# Patient Record
Sex: Female | Born: 1990 | Race: Asian | Hispanic: No | Marital: Married | Smoking: Never smoker
Health system: Southern US, Community
[De-identification: ages and names within clinical notes are randomized; demographics above are authoritative.]

## PROBLEM LIST (undated history)

## (undated) ENCOUNTER — Inpatient Hospital Stay (HOSPITAL_COMMUNITY): Payer: Self-pay

## (undated) DIAGNOSIS — B1910 Unspecified viral hepatitis B without hepatic coma: Secondary | ICD-10-CM

## (undated) HISTORY — DX: Unspecified viral hepatitis B without hepatic coma: B19.10

## (undated) HISTORY — PX: NO PAST SURGERIES: SHX2092

---

## 2016-10-18 ENCOUNTER — Other Ambulatory Visit: Payer: Self-pay | Admitting: Family Medicine

## 2016-10-18 ENCOUNTER — Ambulatory Visit (INDEPENDENT_AMBULATORY_CARE_PROVIDER_SITE_OTHER): Payer: No Typology Code available for payment source | Admitting: Family Medicine

## 2016-10-18 ENCOUNTER — Ambulatory Visit (HOSPITAL_COMMUNITY)
Admission: RE | Admit: 2016-10-18 | Discharge: 2016-10-18 | Disposition: A | Discharge status: Home / Self Care | Payer: PRIVATE HEALTH INSURANCE | Source: Ambulatory Visit | Attending: Family Medicine | Admitting: Family Medicine

## 2016-10-18 VITALS — BP 100/62 | HR 72 | Temp 98.2°F | Resp 16 | Ht 63.0 in | Wt 127.0 lb

## 2016-10-18 DIAGNOSIS — Z3A01 Less than 8 weeks gestation of pregnancy: Secondary | ICD-10-CM

## 2016-10-18 DIAGNOSIS — N912 Amenorrhea, unspecified: Secondary | ICD-10-CM

## 2016-10-18 DIAGNOSIS — Z3689 Encounter for other specified antenatal screening: Secondary | ICD-10-CM | POA: Diagnosis not present

## 2016-10-18 LAB — POCT URINE PREGNANCY: Preg Test, Ur: POSITIVE — AB

## 2016-10-18 LAB — POCT URINALYSIS DIP (MANUAL ENTRY)
BILIRUBIN UA: NEGATIVE
BILIRUBIN UA: NEGATIVE
GLUCOSE UA: NEGATIVE
Leukocytes, UA: NEGATIVE
Nitrite, UA: NEGATIVE
PH UA: 7
Protein Ur, POC: NEGATIVE
RBC UA: NEGATIVE
Spec Grav, UA: 1.025
Urobilinogen, UA: 0.2

## 2016-10-18 NOTE — Progress Notes (Signed)
Subjective:    Patient ID: Stephanie [name]  Nedra Hai(JULIE) Aldrete, female    DOB: 03-Aug-1991, 25 y.o.   MRN: [id number]  10/18/2016  Headache; Nausea; and Other (recently found out that she was pregnant)   HPI This 25 y.o. female presents for evaluation of recent discovery of pregnancy.  Having nausea and vomiting and headache.  LMP 09-11-2016.   [facility] [facility] [place]; tested and was positive.   Not sure if planning to keep the baby.  Very regular menses; bleeding every five days. Has an appointment on 10-29-2016.  Did not like this office; thus, coming here for guidance.  Vomiting 1-2 times every day; after each meal, vomits.  Just started this week.  Will be 10/25/16-10/31/16.  Review of Systems  Constitutional: Positive for appetite change. Negative for chills, diaphoresis, fatigue and fever.  Gastrointestinal: Positive for nausea and vomiting.    No past medical history on file. No past surgical history on file. No Known Allergies No current outpatient prescriptions on file.   No current facility-administered medications for this visit.    Social History   Social History  . Marital status: Single    Spouse name: N/A  . Number of children: N/A  . Years of education: N/A   Occupational History  . Not on file.   Social History Main Topics  . Smoking status: Never Smoker  . Smokeless tobacco: Never Used  . Alcohol use Not on file  . Drug use: Unknown  . Sexual activity: Not on file   Other Topics Concern  . Not on file   Social History Narrative   From [place]; in [place] for 3 months.    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     Objective:    BP 100/62 (BP Location: Right Arm, Patient Position: Sitting, Cuff Size: Normal)   Pulse 72   Temp 98.2 F (36.8 C)   Resp 16   Ht 5\' 3"  (1.6 m)   Wt 127 lb (57.6 kg)   LMP 09/11/2016  (Approximate)   SpO2 100%   BMI 22.50 kg/m  Physical Exam  Constitutional: She is oriented to person, place, and time. She appears well-developed and well-nourished. No distress.  HENT:  Head: Normocephalic and atraumatic.  Right Ear: External ear normal.  Left Ear: External ear normal.  Nose: Nose normal.  Mouth/Throat: Oropharynx is clear and moist.  Eyes: Conjunctivae and EOM are normal. Pupils are equal, round, and reactive to light.  Neck: Normal range of motion. Neck supple. Carotid bruit is not present. No thyromegaly present.  Cardiovascular: Normal rate, regular rhythm, normal heart sounds and intact distal pulses.  Exam reveals no gallop and no friction rub.   No murmur heard. Pulmonary/Chest: Effort normal and breath sounds normal. She has no wheezes. She has no rales.  Abdominal: Soft. Bowel sounds are normal. She exhibits no distension and no mass. There is no tenderness. There is no rebound and no guarding.  Lymphadenopathy:    She has no cervical adenopathy.  Neurological: She is alert and oriented to person, place, and time. No cranial nerve deficit.  Skin: Skin is warm and dry. No rash noted. She is not diaphoretic. No erythema. No pallor.  Psychiatric: She has a normal mood and affect. Her behavior is normal.   Results for orders  placed or performed in visit on 10/18/16  POCT urinalysis dipstick  Result Value Ref Range   Color, UA yellow yellow   Clarity, UA clear clear   Glucose, UA negative negative   Bilirubin, UA negative negative   Ketones, POC UA negative negative   Spec Grav, UA 1.025    Blood, UA negative negative   pH, UA 7.0    Protein Ur, POC negative negative   Urobilinogen, UA 0.2    Nitrite, UA Negative Negative   Leukocytes, UA Negative Negative  POCT urine pregnancy  Result Value Ref Range   Preg Test, Ur Positive (A) Negative        Assessment & Plan:   1. Amenorrhea   2. Less than [redacted] weeks gestation of pregnancy    -New; obtain OB  ultrasound for confirmation of dates so that patient can proceed with decision regarding pregnancy. -provided patient with a list of local clinics who offer abortion therapies.  Orders Placed This Encounter  Procedures  . US OB Transvaginal    Standing Status:   Future    Standing Expiration Date:   12/18/2017    Order Specific Question:   Reason for Exam (SYMPTOM  OR DIAGNOSIS REQUIRED)    Answer:   urine pregnancy +; confirmation of dates    Order Specific Question:   Preferred imaging location?    [facility]  . US OP OB Comp Less 14 Wks    Standing Status:   Future    Standing Expiration Date:   12/18/2017    Order Specific Question:   Reason for Exam (SYMPTOM  OR DIAGNOSIS REQUIRED)    Answer:   urine pregnancy +; confirmation of dates    Order Specific Question:   Preferred imaging location?    [facility]  . POCT urinalysis dipstick  . POCT urine pregnancy   No orders of the defined types were placed in this encounter.   No Follow-up on file.   Reyes Aldaco Paulita FujitaMartin Marky Buresh, M.D. [facility]  [place] [address] [place], [place]  [id number] [phone] phone [phone] fax

## 2016-10-18 NOTE — Patient Instructions (Addendum)
[facility] today at 2:45pm for an Ultrasound.  Go directly to admitting and they will escort you to Radiology for a 3:00 appointment.    Please call for an appointment: [facility] [address] [place], [place]  [id number] [phone]    IF you received an x-ray today, you will receive an invoice from [facility]. Please contact [facility] at [phone] with questions or concerns regarding your invoice.   IF you received labwork today, you will receive an invoice from [facility]. Please contact [facility] at [phone] with questions or concerns regarding your invoice.   Our billing staff will not be able to assist you with questions regarding bills from these companies.  You will be contacted with the lab results as soon as they are available. The fastest way to get your results is to activate your My Chart account. Instructions are located on the last page of this paperwork. If you have not heard from us regarding the results in 2 weeks, please contact this office.

## 2016-10-22 ENCOUNTER — Encounter: Payer: Self-pay | Admitting: Radiology

## 2016-10-22 ENCOUNTER — Telehealth: Payer: Self-pay

## 2016-10-22 NOTE — Progress Notes (Signed)
Attempted to contact pt, spoke with BF and told him to have her give us a call back.

## 2016-10-22 NOTE — Telephone Encounter (Signed)
Patient's significant other called.  He states that Dr. Katrinka BlazingSmith was supposed to read the ultrasound results from [facility].  I told him that the physician over at the hospital should be the one who would give the results to them but he stated otherwise.  Please advise  [phone]

## 2016-10-23 NOTE — Telephone Encounter (Signed)
Patient contacted by Micah,rad tech 10/22/16 at 12:26pm.

## 2016-11-21 LAB — OB RESULTS CONSOLE ABO/RH: RH Type: POSITIVE

## 2016-11-21 LAB — OB RESULTS CONSOLE RPR: RPR: NONREACTIVE

## 2016-11-21 LAB — OB RESULTS CONSOLE GC/CHLAMYDIA: CHLAMYDIA, DNA PROBE: NEGATIVE

## 2016-11-21 LAB — OB RESULTS CONSOLE ANTIBODY SCREEN: ANTIBODY SCREEN: NEGATIVE

## 2016-11-21 LAB — OB RESULTS CONSOLE HIV ANTIBODY (ROUTINE TESTING): HIV: NONREACTIVE

## 2016-11-21 LAB — OB RESULTS CONSOLE RUBELLA ANTIBODY, IGM: RUBELLA: IMMUNE

## 2016-12-02 NOTE — L&D Delivery Note (Signed)
Indication for operative vaginal delivery:  Prolonged deceleration, minimal progress for stage 2 of labor with good maternal effort and maternal exhaustion.  Risks of vacuum assistance were discussed in detail, including but not limited to, bleeding, infection, damage to maternal tissues, fetal cephalohematoma, inability to effect vaginal delivery of the head or shoulder dystocia that cannot be resolved by established maneuvers and need for emergency cesarean section.  Patient gave verbal consent. Patient and husband were able to verbalize back risks/benefits without an interpretor present.   Patient was examined and found to be fully dilated with fetal station of +2. The soft Kiwi vacuum soft cup was positioned over the sagittal suture 3 cm anterior to posterior fontanelle.  Pressure was then increased to 500 mmHg, and the patient was instructed to push.  Pulling was administered along the pelvic curve.  7 pulls were administered during 7 pushes, 0 popoffs. A midline episiotomy was performed to deliver the head through the outlet. The infant was then delivered atraumatically in OA position, anterior shoulders delivered with ease, Baby was noted to be a viable female infant. 60 sec delayed cord clamping.  Cord clamped x2 and cut. There was spontaneous placental delivery, intact with three-vessel cord. Fundus firm on exam with massage and pitocin. Good hemostasis noted.  Anesthesia: Epidural Laceration: 2nd degree perineal with midline episiotomy Suture: 3-0 Vicryl Good hemostasis noted. EBL: 200cc  Mom and baby recovering in LDR.   Weight: Pending skin to skin  Sponge and instrument count were correct x2. Placenta sent to pathology.  Dr. Alysia PennaErvin was present for entire vacuum delivery.   Jen MowElizabeth Waldine Zenz, DO OB Fellow Center for [facility], [facility] 05/24/2017, 1:27 PM

## 2017-01-30 ENCOUNTER — Ambulatory Visit (INDEPENDENT_AMBULATORY_CARE_PROVIDER_SITE_OTHER): Payer: Self-pay | Admitting: Obstetrics and Gynecology

## 2017-01-30 ENCOUNTER — Encounter: Payer: Self-pay | Admitting: Obstetrics and Gynecology

## 2017-01-30 VITALS — BP 100/71 | HR 93 | Wt 145.7 lb

## 2017-01-30 DIAGNOSIS — Z3492 Encounter for supervision of normal pregnancy, unspecified, second trimester: Secondary | ICD-10-CM | POA: Insufficient documentation

## 2017-01-30 DIAGNOSIS — Z789 Other specified health status: Secondary | ICD-10-CM | POA: Insufficient documentation

## 2017-01-30 DIAGNOSIS — Z603 Acculturation difficulty: Secondary | ICD-10-CM | POA: Insufficient documentation

## 2017-01-30 MED ORDER — PRENATAL VITAMINS 0.8 MG PO TABS: 1.0000 | ORAL_TABLET | Freq: Every day | ORAL | 6 refills | Status: DC

## 2017-01-30 NOTE — Progress Notes (Signed)
   PRENATAL VISIT NOTE  Subjective:  Nickolas MadridZuli Julie Charley is a 26 y.o. G1P0 at 2112w1d being seen today for ongoing prenatal care.  She is currently monitored for the following issues for this low-risk pregnancy and has Supervision of low-risk pregnancy, second trimester on her problem list.  Patient reports no complaints.  Contractions: Not present. Vag. Bleeding: None.  Movement: Present. Denies leaking of fluid.   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 She had 3 prenatal care visits there. She has had some blood work done which was reviewed in addition to a PAP test. She has Water quality scientistpurchased insurance on the market place and it will be active April first. She asks that we wait to do any more testing until then.   The following portions of the patient's history were reviewed and updated as appropriate: allergies, current medications, past family history, past medical history, past social history, past surgical history and problem list. Problem list updated.  Objective:   Vitals:   01/30/17 1330  BP: 100/71  Pulse: 93  Weight: 145 lb 11.2 oz (66.1 kg)    Fetal Status: Fetal Heart Rate (bpm): 145   Movement: Present     General:  Alert, oriented and cooperative. Patient is in no acute distress.  Skin: Skin is warm and dry. No rash noted.   Cardiovascular: Normal heart rate noted  Respiratory: Normal respiratory effort, no problems with respiration noted  Abdomen: Soft, gravid, appropriate for gestational age. Pain/Pressure: Absent     Pelvic:  Cervical exam deferred        Extremities: Normal range of motion.  Edema: None  Mental Status: Normal mood and affect. Normal behavior. Normal judgment and thought content.   Assessment and Plan:  Pregnancy: G1P0 at 6712w1d  1. Supervision of low-risk pregnancy, second trimester - US MFM OB COMP + 14 WK; Future Please schedule after April 1st when insurance is active -patient will need GC/CT, Urine Culture,  CBC, HbSAg in April after insurance is active. -PAP done in [place] patient is working on getting request results.   Preterm labor symptoms and general obstetric precautions including but not limited to vaginal bleeding, contractions, leaking of fluid and fetal movement were reviewed in detail with the patient. Please refer to After Visit Summary for other counseling recommendations.  No Follow-up on file.   Lorne SkeensNicholas Michael Schenk, MD

## 2017-01-30 NOTE — Patient Instructions (Signed)
First Trimester of Pregnancy The first trimester of pregnancy is from week 1 until the end of week 13 (months 1 through 3). During this time, your baby will begin to develop inside you. At 6-8 weeks, the eyes and face are formed, and the heartbeat can be seen on ultrasound. At the end of 12 weeks, all the baby's organs are formed. Prenatal care is all the medical care you receive before the birth of your baby. Make sure you get good prenatal care and follow all of your doctor's instructions. Follow these instructions at home: Medicines  Take over-the-counter and prescription medicines only as told by your doctor. Some medicines are safe and some medicines are not safe during pregnancy.  Take a prenatal vitamin that contains at least 600 micrograms (mcg) of folic acid.  If you have trouble pooping (constipation), take medicine that will make your stool soft (stool softener) if your doctor approves. Eating and drinking  Eat regular, healthy meals.  Your doctor will tell you the amount of weight gain that is right for you.  Avoid raw meat and uncooked cheese.  If you feel sick to your stomach (nauseous) or throw up (vomit): ? Eat 4 or 5 small meals a day instead of 3 large meals. ? Try eating a few soda crackers. ? Drink liquids between meals instead of during meals.  To prevent constipation: ? Eat foods that are high in fiber, like fresh fruits and vegetables, whole grains, and beans. ? Drink enough fluids to keep your pee (urine) clear or pale yellow. Activity  Exercise only as told by your doctor. Stop exercising if you have cramps or pain in your lower belly (abdomen) or low back.  Do not exercise if it is too hot, too humid, or if you are in a place of great height (high altitude).  Try to avoid standing for long periods of time. Move your legs often if you must stand in one place for a long time.  Avoid heavy lifting.  Wear low-heeled shoes. Sit and stand up straight.  You  can have sex unless your doctor tells you not to. Relieving pain and discomfort  Wear a good support bra if your breasts are sore.  Take warm water baths (sitz baths) to soothe pain or discomfort caused by hemorrhoids. Use hemorrhoid cream if your doctor says it is okay.  Rest with your legs raised if you have leg cramps or low back pain.  If you have puffy, bulging veins (varicose veins) in your legs: ? Wear support hose or compression stockings as told by your doctor. ? Raise (elevate) your feet for 15 minutes, 3-4 times a day. ? Limit salt in your food. Prenatal care  Schedule your prenatal visits by the twelfth week of pregnancy.  Write down your questions. Take them to your prenatal visits.  Keep all your prenatal visits as told by your doctor. This is important. Safety  Wear your seat belt at all times when driving.  Make a list of emergency phone numbers. The list should include numbers for family, friends, the hospital, and police and fire departments. General instructions  Ask your doctor for a referral to a local prenatal class. Begin classes no later than at the start of month 6 of your pregnancy.  Ask for help if you need counseling or if you need help with nutrition. Your doctor can give you advice or tell you where to go for help.  Do not use hot tubs, steam rooms, or   saunas.  Do not douche or use tampons or scented sanitary pads.  Do not cross your legs for long periods of time.  Avoid all herbs and alcohol. Avoid drugs that are not approved by your doctor.  Do not use any tobacco products, including cigarettes, chewing tobacco, and electronic cigarettes. If you need help quitting, ask your doctor. You may get counseling or other support to help you quit.  Avoid cat litter boxes and soil used by cats. These carry germs that can cause birth defects in the baby and can cause a loss of your baby (miscarriage) or stillbirth.  Visit your dentist. At home, brush  your teeth with a soft toothbrush. Be gentle when you floss. Contact a doctor if:  You are dizzy.  You have mild cramps or pressure in your lower belly.  You have a nagging pain in your belly area.  You continue to feel sick to your stomach, you throw up, or you have watery poop (diarrhea).  You have a bad smelling fluid coming from your vagina.  You have pain when you pee (urinate).  You have increased puffiness (swelling) in your face, hands, legs, or ankles. Get help right away if:  You have a fever.  You are leaking fluid from your vagina.  You have spotting or bleeding from your vagina.  You have very bad belly cramping or pain.  You gain or lose weight rapidly.  You throw up blood. It may look like coffee grounds.  You are around people who have German measles, fifth disease, or chickenpox.  You have a very bad headache.  You have shortness of breath.  You have any kind of trauma, such as from a fall or a car accident. Summary  The first trimester of pregnancy is from week 1 until the end of week 13 (months 1 through 3).  To take care of yourself and your unborn baby, you will need to eat healthy meals, take medicines only if your doctor tells you to do so, and do activities that are safe for you and your baby.  Keep all follow-up visits as told by your doctor. This is important as your doctor will have to ensure that your baby is healthy and growing well. This information is not intended to replace advice given to you by your health care provider. Make sure you discuss any questions you have with your health care provider. Document Released: 05/06/2008 Document Revised: 11/26/2016 Document Reviewed: 11/26/2016 Elsevier Interactive Patient Education  2017 Elsevier Inc.  

## 2017-02-03 ENCOUNTER — Inpatient Hospital Stay (HOSPITAL_COMMUNITY): Payer: Self-pay

## 2017-02-03 ENCOUNTER — Encounter (HOSPITAL_COMMUNITY): Payer: Self-pay

## 2017-02-03 ENCOUNTER — Inpatient Hospital Stay (HOSPITAL_COMMUNITY)
Admission: AD | Admit: 2017-02-03 | Discharge: 2017-02-03 | Disposition: A | Discharge status: Home / Self Care | Payer: Self-pay | Source: Ambulatory Visit | Attending: Family Medicine | Admitting: Family Medicine

## 2017-02-03 DIAGNOSIS — O26892 Other specified pregnancy related conditions, second trimester: Secondary | ICD-10-CM | POA: Insufficient documentation

## 2017-02-03 DIAGNOSIS — Z0371 Encounter for suspected problem with amniotic cavity and membrane ruled out: Secondary | ICD-10-CM

## 2017-02-03 DIAGNOSIS — Z3492 Encounter for supervision of normal pregnancy, unspecified, second trimester: Secondary | ICD-10-CM

## 2017-02-03 DIAGNOSIS — O42912 Preterm premature rupture of membranes, unspecified as to length of time between rupture and onset of labor, second trimester: Secondary | ICD-10-CM

## 2017-02-03 DIAGNOSIS — Z3A20 20 weeks gestation of pregnancy: Secondary | ICD-10-CM | POA: Insufficient documentation

## 2017-02-03 LAB — AMNISURE RUPTURE OF MEMBRANE (ROM) NOT AT ARMC: Amnisure ROM: NEGATIVE

## 2017-02-03 NOTE — MAU Note (Signed)
Urine in lab 

## 2017-02-03 NOTE — MAU Provider Note (Signed)
History     CSN: [id number]  Arrival date and time: 02/03/17 1250   First Provider Initiated Contact with Patient 02/03/17 1320      Chief Complaint  Patient presents with  . Possible ROM   HPI Stephanie [name] is 26 y.o. G1P0 [redacted]w[redacted]d weeks presenting with questionable leaking of membranes.  [facility]  Leaking occurred this am after going to the bathroom.  She states she took 3 steps and had leaking.  Neg for bleeding.  Mild cramping.  Last intercourse was this am.  [employer] interpreter service was used.  Patient speaks [place].   Past Medical History:  Diagnosis Date  . Medical history non-contributory     Past Surgical History:  Procedure Laterality Date  . NO PAST SURGERIES      History reviewed. No pertinent family history.  Social History  Substance Use Topics  . Smoking status: Never Smoker  . Smokeless tobacco: Never Used  . Alcohol use No    Allergies: No Known Allergies  Prescriptions Prior to Admission  Medication Sig Dispense Refill Last Dose  . Prenatal Multivit-Min-Fe-FA (PRENATAL VITAMINS) 0.8 MG tablet Take 1 tablet by mouth daily. 30 tablet 6     Review of Systems  Constitutional: Negative for activity change.  Gastrointestinal: Negative for abdominal pain.       + for mild cramping  Genitourinary: Negative for difficulty urinating, dysuria, frequency, hematuria and vaginal bleeding.       Leaking of fluid from vagina   Physical Exam   Blood pressure 109/76, pulse 71, temperature 98.2 F (36.8 C), last menstrual period 09/11/2016.  Physical Exam  Nursing note and vitals reviewed. Constitutional: She is oriented to person, place, and time. She appears well-developed and well-nourished. No distress.  HENT:  Head: Normocephalic.  Neck: Normal range of motion.  Cardiovascular: Normal rate.   Respiratory: Effort normal.  GI: Soft. She exhibits no distension and no mass. There is no tenderness. There is no rebound and  no guarding.  Genitourinary: There is no rash, tenderness or lesion on the right labia. There is no rash, tenderness or lesion on the left labia. There is bleeding in the vagina. No erythema or tenderness in the vagina. Vaginal discharge (small amount of cloudy discharge without bleeding.  Neg for odor) found.  Genitourinary Comments: Sterile speculum was gently inserted.  Digital/pelvic exam deferred.  Amnisure collected.   Neurological: She is alert and oriented to person, place, and time.  Skin: Skin is warm.  Psychiatric: She has a normal mood and affect.   Results for orders placed or performed during the hospital encounter of 02/03/17 (from the past 24 hour(s))  Amnisure rupture of membrane (rom)not at [facility]     Status: None   Collection Time: 02/03/17 11:29 AM  Result Value Ref Range   Amnisure ROM NEGATIVE    U/S  Cervical length 3.5cm; largest pocket of amniotic fluid 6cm, subjectively normal amount of AF.  MAU Course  Procedures  MDM MSE Exam Lab U/S--see results Dr. Ashok Pall came by and asked about patient, he saw her in the clinic this week.  I reviewed results of U/S and Amnisure with him.  Will discharge to home.  Assessment and Plan  A: Leaking of vaginal fluid     Second trimester pregnancy [redacted]w[redacted]d gestation by dates.     U/S report-3.5cm cervical length, normal amount of amniotic fluid.    Amniosure Neg  P:  Patient reassured  Keeps scheduled OB appt      Eve M Chas Axel 02/03/2017, 1:35 PM

## 2017-02-03 NOTE — Discharge Instructions (Signed)
Second Trimester of Pregnancy The second trimester is from week 13 through week 28, month 4 through 6. This is often the time in pregnancy that you feel your best. Often times, morning sickness has lessened or quit. You may have more energy, and you may get hungry more often. Your unborn baby (fetus) is growing rapidly. At the end of the sixth month, he or she is about 9 inches long and weighs about 1 pounds. You will likely feel the baby move (quickening) between 18 and 20 weeks of pregnancy. Follow these instructions at home:  Avoid all smoking, herbs, and alcohol. Avoid drugs not approved by your doctor.  Do not use any tobacco products, including cigarettes, chewing tobacco, and electronic cigarettes. If you need help quitting, ask your doctor. You may get counseling or other support to help you quit.  Only take medicine as told by your doctor. Some medicines are safe and some are not during pregnancy.  Exercise only as told by your doctor. Stop exercising if you start having cramps.  Eat regular, healthy meals.  Wear a good support bra if your breasts are tender.  Do not use hot tubs, steam rooms, or saunas.  Wear your seat belt when driving.  Avoid raw meat, uncooked cheese, and liter boxes and soil used by cats.  Take your prenatal vitamins.  Take 1500-2000 milligrams of calcium daily starting at the 20th week of pregnancy until you deliver your baby.  Try taking medicine that helps you poop (stool softener) as needed, and if your doctor approves. Eat more fiber by eating fresh fruit, vegetables, and whole grains. Drink enough fluids to keep your pee (urine) clear or pale yellow.  Take warm water baths (sitz baths) to soothe pain or discomfort caused by hemorrhoids. Use hemorrhoid cream if your doctor approves.  If you have puffy, bulging veins (varicose veins), wear support hose. Raise (elevate) your feet for 15 minutes, 3-4 times a day. Limit salt in your diet.  Avoid heavy  lifting, wear low heals, and sit up straight.  Rest with your legs raised if you have leg cramps or low back pain.  Visit your dentist if you have not gone during your pregnancy. Use a soft toothbrush to brush your teeth. Be gentle when you floss.  You can have sex (intercourse) unless your doctor tells you not to.  Go to your doctor visits. Get help if:  You feel dizzy.  You have mild cramps or pressure in your lower belly (abdomen).  You have a nagging pain in your belly area.  You continue to feel sick to your stomach (nauseous), throw up (vomit), or have watery poop (diarrhea).  You have bad smelling fluid coming from your vagina.  You have pain with peeing (urination). Get help right away if:  You have a fever.  You are leaking fluid from your vagina.  You have spotting or bleeding from your vagina.  You have severe belly cramping or pain.  You lose or gain weight rapidly.  You have trouble catching your breath and have chest pain.  You notice sudden or extreme puffiness (swelling) of your face, hands, ankles, feet, or legs.  You have not felt the baby move in over an hour.  You have severe headaches that do not go away with medicine.  You have vision changes. This information is not intended to replace advice given to you by your health care provider. Make sure you discuss any questions you have with your health care   provider. Document Released: 02/12/2010 Document Revised: 04/25/2016 Document Reviewed: 01/19/2013 Elsevier Interactive Patient Education  2017 Elsevier Inc.  

## 2017-02-03 NOTE — MAU Note (Signed)
When she got up this morning had a liquid coming out of her vagina and was concerned her water broke. States the liquid was sticky and clear and ran down her legs. No bleeding.

## 2017-03-04 ENCOUNTER — Ambulatory Visit (HOSPITAL_COMMUNITY): Payer: Self-pay

## 2017-03-05 ENCOUNTER — Ambulatory Visit (INDEPENDENT_AMBULATORY_CARE_PROVIDER_SITE_OTHER): Payer: BLUE CROSS/BLUE SHIELD | Admitting: Obstetrics and Gynecology

## 2017-03-05 ENCOUNTER — Ambulatory Visit (HOSPITAL_COMMUNITY)
Admission: RE | Admit: 2017-03-05 | Discharge: 2017-03-05 | Disposition: A | Discharge status: Home / Self Care | Payer: BLUE CROSS/BLUE SHIELD | Source: Ambulatory Visit | Attending: Obstetrics and Gynecology | Admitting: Obstetrics and Gynecology

## 2017-03-05 VITALS — BP 110/79 | HR 79 | Wt 154.2 lb

## 2017-03-05 DIAGNOSIS — Z3492 Encounter for supervision of normal pregnancy, unspecified, second trimester: Secondary | ICD-10-CM

## 2017-03-05 DIAGNOSIS — Z3A25 25 weeks gestation of pregnancy: Secondary | ICD-10-CM | POA: Diagnosis not present

## 2017-03-05 DIAGNOSIS — Z3689 Encounter for other specified antenatal screening: Secondary | ICD-10-CM | POA: Insufficient documentation

## 2017-03-05 DIAGNOSIS — R21 Rash and other nonspecific skin eruption: Secondary | ICD-10-CM

## 2017-03-05 DIAGNOSIS — Z789 Other specified health status: Secondary | ICD-10-CM

## 2017-03-05 NOTE — Progress Notes (Signed)
Language Resources Denice Bors Pt reports itching on her belly that started this am

## 2017-03-05 NOTE — Progress Notes (Signed)
   PRENATAL VISIT NOTE  Subjective:  Stephanie [name] a 26 y.o. G1P0 at [redacted]w[redacted]d being seen today for ongoing prenatal care.  She is currently monitored for the following issues for this low-risk pregnancy and has Supervision of low-risk pregnancy, second trimester and Language barrier affecting health care on her problem list.  Patient reports pruritis of abdomen x 1 day.  Contractions: Not present. Vag. Bleeding: None.  Movement: Present. Denies leaking of fluid.   The following portions of the patient's history were reviewed and updated as appropriate: allergies, current medications, past family history, past medical history, past social history, past surgical history and problem list. Problem list updated.  [place] today, report pending  Objective:   Vitals:   03/05/17 1553  BP: 110/79  Pulse: 79  Weight: 154 lb 3.2 oz (69.9 kg)    Fetal Status: Fetal Heart Rate (bpm): 145   Movement: Present     General:  Alert, oriented and cooperative. Patient is in no acute distress.  Skin: Skin is warm and dry. No rash noted.   Cardiovascular: Normal heart rate noted  Respiratory: Normal respiratory effort, no problems with respiration noted  Abdomen: Soft, gravid, appropriate for gestational age. Pain/Pressure: Absent   Red scratch marks on abdomen, no other lesions  Pelvic:  Cervical exam deferred        Extremities: Normal range of motion.  Edema: None  Mental Status: Normal mood and affect. Normal behavior. Normal judgment and thought content.   Assessment and Plan:  Pregnancy: G1P0 at [redacted]w[redacted]d  1. Supervision of low-risk pregnancy, second trimester Anatomy scan completed today  2. Language barrier affecting health care  3. Localized rash of abdomen Advised cool compresses, 1% hydrocortisone, Benadryl if needed. If not self-limited consider checking bile acids next  Preterm labor symptoms and general obstetric precautions including but not limited to vaginal bleeding, contractions,  leaking of fluid and fetal movement were reviewed in detail with the patient. Please refer to After Visit Summary for other counseling recommendations.  Return in about 3 weeks (around 03/26/2017).   Danae Orleans, CNM

## 2017-03-05 NOTE — Patient Instructions (Signed)

## 2017-03-26 ENCOUNTER — Ambulatory Visit (INDEPENDENT_AMBULATORY_CARE_PROVIDER_SITE_OTHER): Payer: BLUE CROSS/BLUE SHIELD | Admitting: Family Medicine

## 2017-03-26 VITALS — BP 116/72 | HR 81 | Wt 156.9 lb

## 2017-03-26 DIAGNOSIS — Z3490 Encounter for supervision of normal pregnancy, unspecified, unspecified trimester: Secondary | ICD-10-CM

## 2017-03-26 DIAGNOSIS — Z23 Encounter for immunization: Secondary | ICD-10-CM | POA: Diagnosis not present

## 2017-03-26 DIAGNOSIS — Z3403 Encounter for supervision of normal first pregnancy, third trimester: Secondary | ICD-10-CM

## 2017-03-26 MED ORDER — TETANUS-DIPHTH-ACELL PERTUSSIS 5-2.5-18.5 LF-MCG/0.5 IM SUSP
0.5000 mL | Freq: Once | INTRAMUSCULAR | Status: AC
  Administered 2017-03-26: 0.5 mL via INTRAMUSCULAR

## 2017-03-26 NOTE — Progress Notes (Signed)
tdap vaccine given/ information sheet given   28 wk packet given

## 2017-03-26 NOTE — Patient Instructions (Addendum)
Contraception Choices Contraception (birth control) is the use of any methods or devices to prevent pregnancy. Below are some methods to help avoid pregnancy. Hormonal methods  Contraceptive implant. This is a thin, plastic tube containing progesterone hormone. It does not contain estrogen hormone. Your health care provider inserts the tube in the inner part of the upper arm. The tube can remain in place for up to 3 years. After 3 years, the implant must be removed. The implant prevents the ovaries from releasing an egg (ovulation), thickens the cervical mucus to prevent sperm from entering the uterus, and thins the lining of the inside of the uterus.  Progesterone-only injections. These injections are given every 3 months by your health care provider to prevent pregnancy. This synthetic progesterone hormone stops the ovaries from releasing eggs. It also thickens cervical mucus and changes the uterine lining. This makes it harder for sperm to survive in the uterus.  Birth control pills. These pills contain estrogen and progesterone hormone. They work by preventing the ovaries from releasing eggs (ovulation). They also cause the cervical mucus to thicken, preventing the sperm from entering the uterus. Birth control pills are prescribed by a health care provider.Birth control pills can also be used to treat heavy periods.  Minipill. This type of birth control pill contains only the progesterone hormone. They are taken every day of each month and must be prescribed by your health care provider.  Birth control patch. The patch contains hormones similar to those in birth control pills. It must be changed once a week and is prescribed by a health care provider.  Vaginal ring. The ring contains hormones similar to those in birth control pills. It is left in the vagina for 3 weeks, removed for 1 week, and then a new one is put back in place. The patient must be comfortable inserting and removing the ring from  the vagina.A health care provider's prescription is necessary.  Emergency contraception. Emergency contraceptives prevent pregnancy after unprotected sexual intercourse. This pill can be taken right after sex or up to 5 days after unprotected sex. It is most effective the sooner you take the pills after having sexual intercourse. Most emergency contraceptive pills are available without a prescription. Check with your pharmacist. Do not use emergency contraception as your only form of birth control. Barrier methods  Female condom. This is a thin sheath (latex or rubber) that is worn over the penis during sexual intercourse. It can be used with spermicide to increase effectiveness.  Female condom. This is a soft, loose-fitting sheath that is put into the vagina before sexual intercourse.  Diaphragm. This is a soft, latex, dome-shaped barrier that must be fitted by a health care provider. It is inserted into the vagina, along with a spermicidal jelly. It is inserted before intercourse. The diaphragm should be left in the vagina for 6 to 8 hours after intercourse.  Cervical cap. This is a round, soft, latex or plastic cup that fits over the cervix and must be fitted by a health care provider. The cap can be left in place for up to 48 hours after intercourse.  Sponge. This is a soft, circular piece of polyurethane foam. The sponge has spermicide in it. It is inserted into the vagina after wetting it and before sexual intercourse.  Spermicides. These are chemicals that kill or block sperm from entering the cervix and uterus. They come in the form of creams, jellies, suppositories, foam, or tablets. They do not require a prescription. They   are inserted into the vagina with an applicator before having sexual intercourse. The process must be repeated every time you have sexual intercourse. Intrauterine contraception  Intrauterine device (IUD). This is a T-shaped device that is put in a woman's uterus during  a menstrual period to prevent pregnancy. There are 2 types:  Copper IUD. This type of IUD is wrapped in copper wire and is placed inside the uterus. Copper makes the uterus and fallopian tubes produce a fluid that kills sperm. It can stay in place for 10 years.  Hormone IUD. This type of IUD contains the hormone progestin (synthetic progesterone). The hormone thickens the cervical mucus and prevents sperm from entering the uterus, and it also thins the uterine lining to prevent implantation of a fertilized egg. The hormone can weaken or kill the sperm that get into the uterus. It can stay in place for 3-5 years, depending on which type of IUD is used. Permanent methods of contraception  Female tubal ligation. This is when the woman's fallopian tubes are surgically sealed, tied, or blocked to prevent the egg from traveling to the uterus.  Hysteroscopic sterilization. This involves placing a small coil or insert into each fallopian tube. Your doctor uses a technique called hysteroscopy to do the procedure. The device causes scar tissue to form. This results in permanent blockage of the fallopian tubes, so the sperm cannot fertilize the egg. It takes about 3 months after the procedure for the tubes to become blocked. You must use another form of birth control for these 3 months.  Female sterilization. This is when the female has the tubes that carry sperm tied off (vasectomy).This blocks sperm from entering the vagina during sexual intercourse. After the procedure, the man can still ejaculate fluid (semen). Natural planning methods  Natural family planning. This is not having sexual intercourse or using a barrier method (condom, diaphragm, cervical cap) on days the woman could become pregnant.  Calendar method. This is keeping track of the length of each menstrual cycle and identifying when you are fertile.  Ovulation method. This is avoiding sexual intercourse during ovulation.  Symptothermal method.  This is avoiding sexual intercourse during ovulation, using a thermometer and ovulation symptoms.  Post-ovulation method. This is timing sexual intercourse after you have ovulated. Regardless of which type or method of contraception you choose, it is important that you use condoms to protect against the transmission of sexually transmitted infections (STIs). Talk with your health care provider about which form of contraception is most appropriate for you. This information is not intended to replace advice given to you by your health care provider. Make sure you discuss any questions you have with your health care provider. Document Released: 11/18/2005 Document Revised: 04/25/2016 Document Reviewed: 05/13/2013 Elsevier Interactive Patient Education  2017 [employer].    Third Trimester of Pregnancy The third trimester is from week 29 through week 42, months 7 through 9. This trimester is when your unborn baby (fetus) is growing very fast. At the end of the ninth month, the unborn baby is about 20 inches in length. It weighs about 6-10 pounds. Follow these instructions at home:  Avoid all smoking, herbs, and alcohol. Avoid drugs not approved by your doctor.  Do not use any tobacco products, including cigarettes, chewing tobacco, and electronic cigarettes. If you need help quitting, ask your doctor. You may get counseling or other support to help you quit.  Only take medicine as told by your doctor. Some medicines are safe and some  during pregnancy.  Exercise only as told by your doctor. Stop exercising if you start having cramps.  Eat regular, healthy meals.  Wear a good support bra if your breasts are tender.  Do not use hot tubs, steam rooms, or saunas.  Wear your seat belt when driving.  Avoid raw meat, uncooked cheese, and liter boxes and soil used by cats.  Take your prenatal vitamins.  Take 1500-2000 milligrams of calcium daily starting at the 20th week of pregnancy until  you deliver your baby.  Try taking medicine that helps you poop (stool softener) as needed, and if your doctor approves. Eat more fiber by eating fresh fruit, vegetables, and whole grains. Drink enough fluids to keep your pee (urine) clear or pale yellow.  Take warm water baths (sitz baths) to soothe pain or discomfort caused by hemorrhoids. Use hemorrhoid cream if your doctor approves.  If you have puffy, bulging veins (varicose veins), wear support hose. Raise (elevate) your feet for 15 minutes, 3-4 times a day. Limit salt in your diet.  Avoid heavy lifting, wear low heels, and sit up straight.  Rest with your legs raised if you have leg cramps or low back pain.  Visit your dentist if you have not gone during your pregnancy. Use a soft toothbrush to brush your teeth. Be gentle when you floss.  You can have sex (intercourse) unless your doctor tells you not to.  Do not travel far distances unless you must. Only do so with your doctor's approval.  Take prenatal classes.  Practice driving to the hospital.  Pack your hospital bag.  Prepare the baby's room.  Go to your doctor visits. Get help if:  You are not sure if you are in labor or if your water has broken.  You are dizzy.  You have mild cramps or pressure in your lower belly (abdominal).  You have a nagging pain in your belly area.  You continue to feel sick to your stomach (nauseous), throw up (vomit), or have watery poop (diarrhea).  You have bad smelling fluid coming from your vagina.  You have pain with peeing (urination). Get help right away if:  You have a fever.  You are leaking fluid from your vagina.  You are spotting or bleeding from your vagina.  You have severe belly cramping or pain.  You lose or gain weight rapidly.  You have trouble catching your breath and have chest pain.  You notice sudden or extreme puffiness (swelling) of your face, hands, ankles, feet, or legs.  You have not felt the  baby move in over an hour.  You have severe headaches that do not go away with medicine.  You have vision changes. This information is not intended to replace advice given to you by your health care provider. Make sure you discuss any questions you have with your health care provider. Document Released: 02/12/2010 Document Revised: 04/25/2016 Document Reviewed: 01/19/2013 Elsevier Interactive Patient Education  2017 Elsevier Inc.  

## 2017-03-26 NOTE — Progress Notes (Signed)
      Subjective:  Stephanie [name] is a 26 y.o. G1P0 at [redacted]w[redacted]d being seen today for ongoing prenatal care.  She is currently monitored for the following issues for this low-risk pregnancy and has Supervision of low-risk pregnancy, second trimester and Language barrier affecting health care on her problem list.  Patient reports no complaints.  Contractions: Not present. Vag. Bleeding: None.  Movement: Present. Denies leaking of fluid.   The following portions of the patient's history were reviewed and updated as appropriate: allergies, current medications, past family history, past medical history, past social history, past surgical history and problem list. Problem list updated.  Objective:   Vitals:   03/26/17 0844  BP: 116/72  Pulse: 81  Weight: 156 lb 14.4 oz (71.2 kg)    Fetal Status: Fetal Heart Rate (bpm): 133 Fundal Height: 27 cm Movement: Present      General:  Alert, oriented and cooperative. Patient is in no acute distress.  Skin: Skin is warm and dry. No rash noted.   Cardiovascular: Normal heart rate noted  Respiratory: Normal respiratory effort, no problems with respiration noted  Abdomen: Soft, gravid, appropriate for gestational age. Pain/Pressure: Present     Pelvic:  Cervical exam deferred        Extremities: Normal range of motion.  Edema: None  Mental Status: Normal mood and affect. Normal behavior. Normal judgment and thought content.   Urinalysis:      Assessment and Plan:  Pregnancy: G1P0 at [redacted]w[redacted]d  1. Encounter for supervision of low-risk pregnancy, antepartum - Reviewed Anatomy US with patient and husband - Glucose Tolerance, 2 Hours w/1 Hour - RPR - HIV antibody (with reflex) - CBC   MOC: LARC MOF: Breastfeeding Preterm labor symptoms and general obstetric precautions including but not limited to vaginal bleeding, contractions, leaking of fluid and fetal movement were reviewed in detail with the patient. Please refer to After Visit Summary for other  counseling recommendations.  Return in about 4 weeks (around 04/23/2017) for Routine OB visit.   Jen Mow, DO [facility]

## 2017-03-27 LAB — CBC
HEMATOCRIT: 36.4 % (ref 34.0–46.6)
HEMOGLOBIN: 12 g/dL (ref 11.1–15.9)
MCH: 31.3 pg (ref 26.6–33.0)
MCHC: 33 g/dL (ref 31.5–35.7)
MCV: 95 fL (ref 79–97)
Platelets: 196 10*3/uL (ref 150–379)
RBC: 3.84 x10E6/uL (ref 3.77–5.28)
RDW: 13.8 % (ref 12.3–15.4)
WBC: 7.6 10*3/uL (ref 3.4–10.8)

## 2017-03-27 LAB — HIV ANTIBODY (ROUTINE TESTING W REFLEX): HIV Screen 4th Generation wRfx: NONREACTIVE

## 2017-03-27 LAB — GLUCOSE TOLERANCE, 2 HOURS W/ 1HR
GLUCOSE, 2 HOUR: 101 mg/dL (ref 65–152)
GLUCOSE, FASTING: 84 mg/dL (ref 65–91)
Glucose, 1 hour: 90 mg/dL (ref 65–179)

## 2017-03-27 LAB — RPR: RPR: NONREACTIVE

## 2017-04-23 ENCOUNTER — Ambulatory Visit (INDEPENDENT_AMBULATORY_CARE_PROVIDER_SITE_OTHER): Payer: BLUE CROSS/BLUE SHIELD | Admitting: Obstetrics and Gynecology

## 2017-04-23 VITALS — BP 111/70 | HR 89 | Wt 161.5 lb

## 2017-04-23 DIAGNOSIS — Z3403 Encounter for supervision of normal first pregnancy, third trimester: Secondary | ICD-10-CM

## 2017-04-23 DIAGNOSIS — O9989 Other specified diseases and conditions complicating pregnancy, childbirth and the puerperium: Secondary | ICD-10-CM

## 2017-04-23 DIAGNOSIS — R519 Headache, unspecified: Secondary | ICD-10-CM

## 2017-04-23 DIAGNOSIS — R51 Headache: Secondary | ICD-10-CM

## 2017-04-23 DIAGNOSIS — Z3492 Encounter for supervision of normal pregnancy, unspecified, second trimester: Secondary | ICD-10-CM

## 2017-04-23 MED ORDER — BUTALBITAL-APAP-CAFFEINE 50-325-40 MG PO CAPS: ORAL_CAPSULE | ORAL | 0 refills | Status: DC

## 2017-04-23 NOTE — Progress Notes (Signed)
Prenatal Visit Note Date: 04/23/2017 Clinic: [facility]  Subjective:  Stephanie [name] is a 26 y.o. G1P0 at 8341w0d being seen today for ongoing prenatal care.  She is currently monitored for the following issues for this low-risk pregnancy and has Supervision of low-risk pregnancy, second trimester and Language barrier affecting health care on her problem list.  Patient reports h/o chronic HA. She states she got them prior to pregnancy. Has them about once a week and it'll last for about a day or so. Non radiating and it starts at b/l temples. Hasn't tried anything OTC, doesn't consume caffeine.   Contractions: Not present. Vag. Bleeding: None.  Movement: Present. Denies leaking of fluid.   The following portions of the patient's history were reviewed and updated as appropriate: allergies, current medications, past family history, past medical history, past social history, past surgical history and problem list. Problem list updated.  Objective:   Vitals:   04/23/17 1018  BP: 111/70  Pulse: 89  Weight: 161 lb 8 oz (73.3 kg)    Fetal Status: Fetal Heart Rate (bpm): 145 Fundal Height: 32 cm Movement: Present     General:  Alert, oriented and cooperative. Patient is in no acute distress.  Skin: Skin is warm and dry. No rash noted.   Cardiovascular: Normal heart rate noted  Respiratory: Normal respiratory effort, no problems with respiration noted  Abdomen: Soft, gravid, appropriate for gestational age. Pain/Pressure: Present     Pelvic:  Cervical exam deferred        Extremities: Normal range of motion.  Edema: None  Mental Status: Normal mood and affect. Normal behavior. Normal judgment and thought content.   Urinalysis:      Assessment and Plan:  Pregnancy: G1P0 at 3941w0d  1. Nonintractable headache, unspecified chronicity pattern, unspecified headache type Recommend OTC apap ES and PRN script for fioricet given if that doesn't work. Sounds tension type  2.  Supervision of low-risk pregnancy, second trimester Routine care.   Preterm labor symptoms and general obstetric precautions including but not limited to vaginal bleeding, contractions, leaking of fluid and fetal movement were reviewed in detail with the patient. Please refer to After Visit Summary for other counseling recommendations.  Return in about 2 weeks (around 05/07/2017) for 2-3wk rob.   Chamberlain BingPickens, Lynnda Wiersma, MD

## 2017-05-08 ENCOUNTER — Ambulatory Visit (INDEPENDENT_AMBULATORY_CARE_PROVIDER_SITE_OTHER): Payer: BLUE CROSS/BLUE SHIELD | Admitting: Family Medicine

## 2017-05-08 VITALS — BP 121/76 | HR 87 | Wt 163.5 lb

## 2017-05-08 DIAGNOSIS — R519 Headache, unspecified: Secondary | ICD-10-CM

## 2017-05-08 DIAGNOSIS — Z3403 Encounter for supervision of normal first pregnancy, third trimester: Secondary | ICD-10-CM

## 2017-05-08 DIAGNOSIS — Z789 Other specified health status: Secondary | ICD-10-CM

## 2017-05-08 DIAGNOSIS — Z3492 Encounter for supervision of normal pregnancy, unspecified, second trimester: Secondary | ICD-10-CM

## 2017-05-08 DIAGNOSIS — R51 Headache: Secondary | ICD-10-CM

## 2017-05-08 DIAGNOSIS — O26893 Other specified pregnancy related conditions, third trimester: Secondary | ICD-10-CM

## 2017-05-08 NOTE — Progress Notes (Signed)
   PRENATAL VISIT NOTE  Subjective:  Stephanie [name] is a 26 y.o. G1P0 at 6246w1d being seen today for ongoing prenatal care.  She is currently monitored for the following issues for this low-risk pregnancy and has Supervision of low-risk pregnancy, second trimester and Language barrier affecting health care on her problem list.  Patient reports headache. Intermittent, similar to previous. Has history of headache - this feels like normal. Contractions: Irregular. Vag. Bleeding: None.  Movement: Present. Denies leaking of fluid.   The following portions of the patient's history were reviewed and updated as appropriate: allergies, current medications, past family history, past medical history, past social history, past surgical history and problem list. Problem list updated.  Objective:   Vitals:   05/08/17 1259  BP: 121/76  Pulse: 87  Weight: 163 lb 8 oz (74.2 kg)    Fetal Status: Fetal Heart Rate (bpm): 147   Movement: Present     General:  Alert, oriented and cooperative. Patient is in no acute distress.  Skin: Skin is warm and dry. No rash noted.   Cardiovascular: Normal heart rate noted  Respiratory: Normal respiratory effort, no problems with respiration noted  Abdomen: Soft, gravid, appropriate for gestational age. Pain/Pressure: Present     Pelvic:  Cervical exam deferred        Extremities: Normal range of motion.  Edema: None  Mental Status: Normal mood and affect. Normal behavior. Normal judgment and thought content.   Assessment and Plan:  Pregnancy: G1P0 at 5646w1d  1. Supervision of low-risk pregnancy, second trimester FH and FHT normal  2. Language barrier affecting health care Interpreter used  3. Headache in pregnancy, antepartum, third trimester Similar to previous - tension or migraine type. Offered flexeril - declined. Preeclampsia precautions given.   Preterm labor symptoms and general obstetric precautions including but not limited to vaginal bleeding,  contractions, leaking of fluid and fetal movement were reviewed in detail with the patient. Please refer to After Visit Summary for other counseling recommendations.  Return in about 2 weeks (around 05/22/2017).   Levie HeritageJacob J Markeese Boyajian, DO

## 2017-05-21 ENCOUNTER — Ambulatory Visit (INDEPENDENT_AMBULATORY_CARE_PROVIDER_SITE_OTHER): Payer: BLUE CROSS/BLUE SHIELD | Admitting: Obstetrics and Gynecology

## 2017-05-21 VITALS — BP 118/89 | HR 90 | Wt 164.0 lb

## 2017-05-21 DIAGNOSIS — Z3492 Encounter for supervision of normal pregnancy, unspecified, second trimester: Secondary | ICD-10-CM

## 2017-05-21 DIAGNOSIS — Z113 Encounter for screening for infections with a predominantly sexual mode of transmission: Secondary | ICD-10-CM

## 2017-05-21 DIAGNOSIS — Z3493 Encounter for supervision of normal pregnancy, unspecified, third trimester: Secondary | ICD-10-CM

## 2017-05-21 DIAGNOSIS — Z789 Other specified health status: Secondary | ICD-10-CM

## 2017-05-21 DIAGNOSIS — O368130 Decreased fetal movements, third trimester, not applicable or unspecified: Secondary | ICD-10-CM | POA: Diagnosis not present

## 2017-05-21 NOTE — Progress Notes (Signed)
Video Interpreter # [id number]

## 2017-05-21 NOTE — Progress Notes (Signed)
Prenatal Visit Note Date: 05/21/2017 Clinic: [facility]  Subjective:  Stephanie [name] is a 26 y.o. G1P0 at 2959w0d being seen today for ongoing prenatal care.  She is currently monitored for the following issues for this low-risk pregnancy and has Supervision of low-risk pregnancy, second trimester and Language barrier affecting health care on her problem list.  Patient reports decreased fm Contractions: Irritability. Vag. Bleeding: None.  Movement: (!) Decreased. Denies leaking of fluid.   The following portions of the patient's history were reviewed and updated as appropriate: allergies, current medications, past family history, past medical history, past social history, past surgical history and problem list. Problem list updated.  Objective:   Vitals:   05/21/17 0920  BP: 118/89  Pulse: 90  Weight: 164 lb (74.4 kg)    Fetal Status: Fetal Heart Rate (bpm): 152 Fundal Height: 36 cm Movement: (!) Decreased  Presentation: Vertex  General:  Alert, oriented and cooperative. Patient is in no acute distress.  Skin: Skin is warm and dry. No rash noted.   Cardiovascular: Normal heart rate noted  Respiratory: Normal respiratory effort, no problems with respiration noted  Abdomen: Soft, gravid, appropriate for gestational age. Pain/Pressure: Present     Pelvic:  Cervical exam deferred        Extremities: Normal range of motion.  Edema: Trace  Mental Status: Normal mood and affect. Normal behavior. Normal judgment and thought content.   Urinalysis:      Assessment and Plan:  Pregnancy: G1P0 at 6459w0d  1. Encounter for supervision of low-risk pregnancy in third trimester Routine care. D/w pt re: BC nv - Strep Gp B NAA - Cervicovaginal ancillary only  2.. Language barrier affecting health care Interpreter used  4. Decreased fetal movements in third trimester, single or unspecified fetus FKC precautions given.  rNST 125 baseline, +accels, no decel, mod variabiltiy,  q8-2795m UCs x 8536m  Preterm labor symptoms and general obstetric precautions including but not limited to vaginal bleeding, contractions, leaking of fluid and fetal movement were reviewed in detail with the patient. Please refer to After Visit Summary for other counseling recommendations.  No Follow-up on file.   [place] BingPickens, Radwan Cowley, MD

## 2017-05-22 LAB — CERVICOVAGINAL ANCILLARY ONLY
Chlamydia: NEGATIVE
Neisseria Gonorrhea: NEGATIVE

## 2017-05-23 ENCOUNTER — Encounter (HOSPITAL_COMMUNITY): Payer: Self-pay | Admitting: *Deleted

## 2017-05-23 ENCOUNTER — Inpatient Hospital Stay (HOSPITAL_COMMUNITY)
Admission: AD | Admit: 2017-05-23 | Discharge: 2017-05-26 | DRG: 774 | Disposition: A | Discharge status: Home / Self Care | Payer: BLUE CROSS/BLUE SHIELD | Source: Ambulatory Visit | Attending: Obstetrics and Gynecology | Admitting: Obstetrics and Gynecology

## 2017-05-23 DIAGNOSIS — O9842 Viral hepatitis complicating childbirth: Secondary | ICD-10-CM | POA: Diagnosis present

## 2017-05-23 DIAGNOSIS — O42013 Preterm premature rupture of membranes, onset of labor within 24 hours of rupture, third trimester: Principal | ICD-10-CM | POA: Diagnosis present

## 2017-05-23 DIAGNOSIS — B1910 Unspecified viral hepatitis B without hepatic coma: Secondary | ICD-10-CM | POA: Diagnosis present

## 2017-05-23 DIAGNOSIS — Z3A36 36 weeks gestation of pregnancy: Secondary | ICD-10-CM

## 2017-05-23 LAB — URINALYSIS, ROUTINE W REFLEX MICROSCOPIC
Bilirubin Urine: NEGATIVE
GLUCOSE, UA: NEGATIVE mg/dL
KETONES UR: NEGATIVE mg/dL
Leukocytes, UA: NEGATIVE
NITRITE: NEGATIVE
PH: 6 (ref 5.0–8.0)
Protein, ur: NEGATIVE mg/dL
Specific Gravity, Urine: 1.005 (ref 1.005–1.030)

## 2017-05-23 LAB — TYPE AND SCREEN
ABO/RH(D): A POS
Antibody Screen: NEGATIVE

## 2017-05-23 LAB — CBC
HCT: 37.7 % (ref 36.0–46.0)
Hemoglobin: 13 g/dL (ref 12.0–15.0)
MCH: 31.6 pg (ref 26.0–34.0)
MCHC: 34.5 g/dL (ref 30.0–36.0)
MCV: 91.5 fL (ref 78.0–100.0)
Platelets: 178 10*3/uL (ref 150–400)
RBC: 4.12 MIL/uL (ref 3.87–5.11)
RDW: 12.5 % (ref 11.5–15.5)
WBC: 9.7 10*3/uL (ref 4.0–10.5)

## 2017-05-23 LAB — STREP GP B NAA: Strep Gp B NAA: NEGATIVE

## 2017-05-23 LAB — POCT FERN TEST: POCT Fern Test: NEGATIVE

## 2017-05-23 LAB — AMNISURE RUPTURE OF MEMBRANE (ROM) NOT AT ARMC: Amnisure ROM: POSITIVE

## 2017-05-23 MED ORDER — FLEET ENEMA 7-19 GM/118ML RE ENEM: 1.0000 | ENEMA | Freq: Once | RECTAL | Status: DC

## 2017-05-23 MED ORDER — SOD CITRATE-CITRIC ACID 500-334 MG/5ML PO SOLN: 30.0000 mL | ORAL | Status: DC | PRN

## 2017-05-23 MED ORDER — BETAMETHASONE SOD PHOS & ACET 6 (3-3) MG/ML IJ SUSP
12.0000 mg | INTRAMUSCULAR | Status: DC
  Administered 2017-05-23: 12 mg via INTRAMUSCULAR
  Filled 2017-05-23 (×2): qty 2

## 2017-05-23 MED ORDER — OXYCODONE-ACETAMINOPHEN 5-325 MG PO TABS: 2.0000 | ORAL_TABLET | ORAL | Status: DC | PRN

## 2017-05-23 MED ORDER — LACTATED RINGERS IV SOLN
INTRAVENOUS | Status: DC
  Administered 2017-05-23 – 2017-05-24 (×3): via INTRAVENOUS

## 2017-05-23 MED ORDER — OXYTOCIN 40 UNITS IN LACTATED RINGERS INFUSION - SIMPLE MED
2.5000 [IU]/h | INTRAVENOUS | Status: DC
  Filled 2017-05-23: qty 1000

## 2017-05-23 MED ORDER — LIDOCAINE HCL (PF) 1 % IJ SOLN
30.0000 mL | INTRAMUSCULAR | Status: DC | PRN
  Filled 2017-05-23: qty 30

## 2017-05-23 MED ORDER — ONDANSETRON HCL 4 MG/2ML IJ SOLN: 4.0000 mg | Freq: Four times a day (QID) | INTRAMUSCULAR | Status: DC | PRN

## 2017-05-23 MED ORDER — LACTATED RINGERS IV SOLN: 500.0000 mL | INTRAVENOUS | Status: DC | PRN

## 2017-05-23 MED ORDER — OXYTOCIN BOLUS FROM INFUSION
500.0000 mL | Freq: Once | INTRAVENOUS | Status: AC
  Administered 2017-05-24: 500 mL via INTRAVENOUS

## 2017-05-23 MED ORDER — FENTANYL CITRATE (PF) 100 MCG/2ML IJ SOLN
100.0000 ug | INTRAMUSCULAR | Status: DC | PRN
  Filled 2017-05-23: qty 2

## 2017-05-23 MED ORDER — ACETAMINOPHEN 325 MG PO TABS: 650.0000 mg | ORAL_TABLET | ORAL | Status: DC | PRN

## 2017-05-23 MED ORDER — OXYCODONE-ACETAMINOPHEN 5-325 MG PO TABS: 1.0000 | ORAL_TABLET | ORAL | Status: DC | PRN

## 2017-05-23 NOTE — MAU Note (Signed)
Leaking fld for . Clear fld. No pain. Baby has not moved as much today.

## 2017-05-23 NOTE — H&P (Signed)
LABOR AND DELIVERY ADMISSION HISTORY AND PHYSICAL NOTE  Stephanie [name] is a 26 y.o. female G1P0000 with IUP at 2775w2d by LMP consistent with 5 week ultrasound presenting for PPROM @1900  this evening. She reports +FM, + contractions, No LOF, no VB, no blurry vision, headaches or peripheral edema, and RUQ pain.  She plans on breast feeding. She requests LARC for birth control.   Prenatal History/Complications:  Past Medical History: Past Medical History:  Diagnosis Date  . Medical history non-contributory     Past Surgical History: Past Surgical History:  Procedure Laterality Date  . NO PAST SURGERIES      Obstetrical History: OB History    Gravida Para Term Preterm AB Living   1 0 0 0 0 0   SAB TAB Ectopic Multiple Live Births   0 0 0 0 0      Social History: Social History   Social History  . Marital status: Married    Spouse name: N/A  . Number of children: N/A  . Years of education: N/A   Social History Main Topics  . Smoking status: Never Smoker  . Smokeless tobacco: Never Used  . Alcohol use Yes     Comment: occasional, before pregnancy  . Drug use: No  . Sexual activity: Yes   Other Topics Concern  . None   Social History Narrative   From [place]; in [place] 3 months.    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 No pertinent family history.  Allergies: No Known Allergies  Prescriptions Prior to Admission  Medication Sig Dispense Refill Last Dose  . Prenatal Multivit-Min-Fe-FA (PRENATAL VITAMINS) 0.8 MG tablet Take 1 tablet by mouth daily. 30 tablet 6 05/22/2017 at Unknown time  . Butalbital-APAP-Caffeine 50-325-40 MG capsule 1-2 tablets PO 1-2x/week as needed for headache (Patient not taking: Reported on 05/08/2017) 15 capsule 0 Not Taking     Review of Systems   All systems reviewed and negative except as stated in  HPI  Physical Exam Blood pressure 123/84, pulse 97, temperature 98.5 F (36.9 C), resp. rate 18, height 5' 3.5" (1.613 m), weight 75.8 kg (167 lb), last menstrual period 09/11/2016. General appearance: alert, cooperative and appears stated age  Fetal monitoring: 130 bmp, mod var, accels, no decesl Uterine activity: irregular, about every 2048m     Prenatal labs: ABO, Rh: A/Positive/-- (12/21 0000) Antibody: Negative (12/21 0000) Rubella: immune RPR: Non Reactive (04/25 0819)  HBsAg:    HIV: Non Reactive (04/25 0819)  GBS: Negative (06/20 0949)  1 hr Glucola: WNL Genetic screening:  Too late, not completed Anatomy US: normal female  Prenatal Transfer Tool  Maternal Diabetes: No Genetic Screening: not completed Maternal Ultrasounds/Referrals: Normal Fetal Ultrasounds or other Referrals:  None Maternal Substance Abuse:  No Significant Maternal Medications:  None Significant Maternal Lab Results: Lab values include: Group B Strep negative  Results for orders placed or performed during the hospital encounter of 05/23/17 (from the past 24 hour(s))  Urinalysis, Routine w reflex microscopic   Collection Time: 05/23/17  7:50 PM  Result Value Ref Range   Color, Urine YELLOW YELLOW   APPearance CLEAR CLEAR   Specific Gravity, Urine 1.005 1.005 - 1.030   pH 6.0 5.0 - 8.0   Glucose, UA NEGATIVE NEGATIVE mg/dL   Hgb urine dipstick LARGE (A)  NEGATIVE   Bilirubin Urine NEGATIVE NEGATIVE   Ketones, ur NEGATIVE NEGATIVE mg/dL   Protein, ur NEGATIVE NEGATIVE mg/dL   Nitrite NEGATIVE NEGATIVE   Leukocytes, UA NEGATIVE NEGATIVE   RBC / HPF 0-5 0 - 5 RBC/hpf   WBC, UA 0-5 0 - 5 WBC/hpf   Bacteria, UA FEW (A) NONE SEEN   Squamous Epithelial / LPF 0-5 (A) NONE SEEN   Mucous PRESENT    Amorphous Crystal PRESENT   POCT fern test   Collection Time: 05/23/17  8:38 PM  Result Value Ref Range   POCT Fern Test Negative = intact amniotic membranes   Amnisure rupture of membrane (rom)not at [facility]    Collection Time: 05/23/17  8:40 PM  Result Value Ref Range   Amnisure ROM POSITIVE   CBC   Collection Time: 05/23/17  9:15 PM  Result Value Ref Range   WBC 9.7 4.0 - 10.5 K/uL   RBC 4.12 3.87 - 5.11 MIL/uL   Hemoglobin 13.0 12.0 - 15.0 g/dL   HCT 29.5 28.4 - 13.2 %   MCV 91.5 78.0 - 100.0 fL   MCH 31.6 26.0 - 34.0 pg   MCHC 34.5 30.0 - 36.0 g/dL   RDW 44.0 10.2 - 72.5 %   Platelets 178 150 - 400 K/uL    Patient Active Problem List   Diagnosis Date Noted  . Supervision of low-risk pregnancy, second trimester 01/30/2017  . Language barrier affecting health care 01/30/2017    Assessment: Stephanie [name] is a 13 y.o. G1P0000 at [redacted]w[redacted]d here for PPROM @1900  this evening  #Labor: progressing normally #Pain: Epidural on request #FWB: Category I tracing #ID:  GBS neg #MOF: breast #MOC: LARC, undecided #Circ:  undecided  Howard Pouch, MD PGY-1 [facility]  05/23/2017, 9:42 PM   CNM attestation:  I have seen and examined this patient; I agree with above documentation in the resident's note.   Stephanie [name] is a 65 y.o. G1P0000 here for PPROM/latent labor  PE: BP (!) 97/56   Pulse 91   Temp 98.3 F (36.8 C) (Oral)   Resp 18   Ht 5' 3.5" (1.613 m)   Wt 75.8 kg (167 lb)   LMP 09/11/2016 (Approximate)   SpO2 98%   BMI 29.12 kg/m  Gen: calm comfortable, NAD Resp: normal effort, no distress Abd: gravid  ROS, labs, PMH reviewed  Plan: Admit to [employer] Give BMZ GBS neg Expectant management Anticipate SVD  Cam Hai CNM 05/24/2017, 9:28 AM

## 2017-05-24 ENCOUNTER — Inpatient Hospital Stay (HOSPITAL_COMMUNITY): Payer: BLUE CROSS/BLUE SHIELD | Admitting: Anesthesiology

## 2017-05-24 ENCOUNTER — Encounter (HOSPITAL_COMMUNITY): Payer: Self-pay

## 2017-05-24 DIAGNOSIS — Z3A36 36 weeks gestation of pregnancy: Secondary | ICD-10-CM

## 2017-05-24 DIAGNOSIS — O42013 Preterm premature rupture of membranes, onset of labor within 24 hours of rupture, third trimester: Secondary | ICD-10-CM

## 2017-05-24 LAB — ABO/RH: ABO/RH(D): A POS

## 2017-05-24 LAB — RPR: RPR: NONREACTIVE

## 2017-05-24 LAB — HEPATITIS B SURFACE ANTIGEN: HEP B S AG: POSITIVE — AB

## 2017-05-24 MED ORDER — WITCH HAZEL-GLYCERIN EX PADS: 1.0000 "application " | MEDICATED_PAD | CUTANEOUS | Status: DC | PRN

## 2017-05-24 MED ORDER — LACTATED RINGERS IV SOLN
500.0000 mL | Freq: Once | INTRAVENOUS | Status: AC
  Administered 2017-05-24: 500 mL via INTRAVENOUS

## 2017-05-24 MED ORDER — SODIUM CHLORIDE 0.9% FLUSH: 3.0000 mL | INTRAVENOUS | Status: DC | PRN

## 2017-05-24 MED ORDER — EPHEDRINE 5 MG/ML INJ
10.0000 mg | INTRAVENOUS | Status: DC | PRN
  Filled 2017-05-24: qty 2

## 2017-05-24 MED ORDER — ZOLPIDEM TARTRATE 5 MG PO TABS: 5.0000 mg | ORAL_TABLET | Freq: Every evening | ORAL | Status: DC | PRN

## 2017-05-24 MED ORDER — SODIUM CHLORIDE 0.9% FLUSH: 3.0000 mL | Freq: Two times a day (BID) | INTRAVENOUS | Status: DC

## 2017-05-24 MED ORDER — COCONUT OIL OIL: 1.0000 "application " | TOPICAL_OIL | Status: DC | PRN

## 2017-05-24 MED ORDER — SENNOSIDES-DOCUSATE SODIUM 8.6-50 MG PO TABS
2.0000 | ORAL_TABLET | ORAL | Status: DC
  Administered 2017-05-25 – 2017-05-26 (×2): 2 via ORAL
  Filled 2017-05-24 (×2): qty 2

## 2017-05-24 MED ORDER — IBUPROFEN 600 MG PO TABS
600.0000 mg | ORAL_TABLET | Freq: Four times a day (QID) | ORAL | Status: DC
  Administered 2017-05-24 – 2017-05-26 (×8): 600 mg via ORAL
  Filled 2017-05-24 (×8): qty 1

## 2017-05-24 MED ORDER — PHENYLEPHRINE 40 MCG/ML (10ML) SYRINGE FOR IV PUSH (FOR BLOOD PRESSURE SUPPORT)
80.0000 ug | PREFILLED_SYRINGE | INTRAVENOUS | Status: DC | PRN
  Filled 2017-05-24: qty 5

## 2017-05-24 MED ORDER — SIMETHICONE 80 MG PO CHEW: 80.0000 mg | CHEWABLE_TABLET | ORAL | Status: DC | PRN

## 2017-05-24 MED ORDER — FENTANYL 2.5 MCG/ML BUPIVACAINE 1/10 % EPIDURAL INFUSION (WH - ANES)
14.0000 mL/h | INTRAMUSCULAR | Status: DC | PRN
  Administered 2017-05-24: 14 mL/h via EPIDURAL

## 2017-05-24 MED ORDER — PRENATAL MULTIVITAMIN CH
1.0000 | ORAL_TABLET | Freq: Every day | ORAL | Status: DC
  Administered 2017-05-25 – 2017-05-26 (×2): 1 via ORAL
  Filled 2017-05-24 (×2): qty 1

## 2017-05-24 MED ORDER — PHENYLEPHRINE 40 MCG/ML (10ML) SYRINGE FOR IV PUSH (FOR BLOOD PRESSURE SUPPORT)
PREFILLED_SYRINGE | INTRAVENOUS | Status: AC
  Filled 2017-05-24: qty 10

## 2017-05-24 MED ORDER — SODIUM CHLORIDE 0.9 % IV SOLN: 250.0000 mL | INTRAVENOUS | Status: DC | PRN

## 2017-05-24 MED ORDER — DIPHENHYDRAMINE HCL 25 MG PO CAPS: 25.0000 mg | ORAL_CAPSULE | Freq: Four times a day (QID) | ORAL | Status: DC | PRN

## 2017-05-24 MED ORDER — OXYTOCIN 40 UNITS IN LACTATED RINGERS INFUSION - SIMPLE MED
1.0000 m[IU]/min | INTRAVENOUS | Status: DC
  Administered 2017-05-24: 2 m[IU]/min via INTRAVENOUS

## 2017-05-24 MED ORDER — LIDOCAINE HCL (PF) 1 % IJ SOLN
INTRAMUSCULAR | Status: DC | PRN
  Administered 2017-05-24: 3 mL via EPIDURAL
  Administered 2017-05-24: 5 mL via EPIDURAL
  Administered 2017-05-24: 2 mL via EPIDURAL

## 2017-05-24 MED ORDER — DIBUCAINE 1 % RE OINT: 1.0000 "application " | TOPICAL_OINTMENT | RECTAL | Status: DC | PRN

## 2017-05-24 MED ORDER — FENTANYL 2.5 MCG/ML BUPIVACAINE 1/10 % EPIDURAL INFUSION (WH - ANES)
INTRAMUSCULAR | Status: AC
  Filled 2017-05-24: qty 100

## 2017-05-24 MED ORDER — ACETAMINOPHEN 325 MG PO TABS: 650.0000 mg | ORAL_TABLET | ORAL | Status: DC | PRN

## 2017-05-24 MED ORDER — ONDANSETRON HCL 4 MG PO TABS: 4.0000 mg | ORAL_TABLET | ORAL | Status: DC | PRN

## 2017-05-24 MED ORDER — BENZOCAINE-MENTHOL 20-0.5 % EX AERO
1.0000 "application " | INHALATION_SPRAY | CUTANEOUS | Status: DC | PRN
  Administered 2017-05-24: 1 via TOPICAL
  Filled 2017-05-24: qty 56

## 2017-05-24 MED ORDER — OXYTOCIN 40 UNITS IN LACTATED RINGERS INFUSION - SIMPLE MED: 2.5000 [IU]/h | INTRAVENOUS | Status: DC | PRN

## 2017-05-24 MED ORDER — TETANUS-DIPHTH-ACELL PERTUSSIS 5-2.5-18.5 LF-MCG/0.5 IM SUSP: 0.5000 mL | Freq: Once | INTRAMUSCULAR | Status: DC

## 2017-05-24 MED ORDER — TERBUTALINE SULFATE 1 MG/ML IJ SOLN
0.2500 mg | Freq: Once | INTRAMUSCULAR | Status: DC | PRN
  Filled 2017-05-24: qty 1

## 2017-05-24 MED ORDER — DIPHENHYDRAMINE HCL 50 MG/ML IJ SOLN: 12.5000 mg | INTRAMUSCULAR | Status: DC | PRN

## 2017-05-24 MED ORDER — ONDANSETRON HCL 4 MG/2ML IJ SOLN: 4.0000 mg | INTRAMUSCULAR | Status: DC | PRN

## 2017-05-24 NOTE — Anesthesia Procedure Notes (Signed)
Epidural Patient location during procedure: OB Start time: 05/24/2017 6:42 AM End time: 05/24/2017 6:47 AM  Staffing Anesthesiologist: Cecile HearingURK, STEPHEN EDWARD Performed: anesthesiologist   Preanesthetic Checklist Completed: patient identified, pre-op evaluation, timeout performed, IV checked, risks and benefits discussed and monitors and equipment checked  Epidural Patient position: sitting Prep: DuraPrep Patient monitoring: blood pressure and continuous pulse ox Approach: midline Location: L3-L4 Injection technique: LOR air  Needle:  Needle type: Tuohy  Needle gauge: 17 G Needle length: 9 cm Needle insertion depth: 4.5 cm Catheter size: 19 Gauge Catheter at skin depth: 9.5 cm Test dose: negative and Other (1% Lidocaine)  Additional Notes Patient identified.  Risk benefits discussed including failed block, incomplete pain control, headache, nerve damage, paralysis, blood pressure changes, nausea, vomiting, reactions to medication both toxic or allergic, and postpartum back pain.  Patient expressed understanding and wished to proceed.  All questions were answered.  Sterile technique used throughout procedure and epidural site dressed with sterile barrier dressing. No paresthesia or other complications noted. The patient did not experience any signs of intravascular injection such as tinnitus or metallic taste in mouth nor signs of intrathecal spread such as rapid motor block. Please see nursing notes for vital signs. Reason for block:procedure for pain

## 2017-05-24 NOTE — Lactation Note (Signed)
This note was copied from a baby's chart. Lactation Consultation Note  Patient Name: Stephanie Nickolas MadridZuli Julie Tubbs WUJWJ'XToday's Date: 05/24/2017 Reason for consult: Initial assessment;Late preterm infant   Initial assessment with first time parents of LPT infant at request of NNP and RN. Spoke with parents with [facility] Mandarin interpreter, Fleet ContrasYalin [phone]. Dad does speak English and was able to communicate well in [place], mom was able to understand and answer some in English also.  Infant has been jittery and CBG 57.   Infant was STS with mom, RN recently fed infant colostrum via curved tip syringe by CN RN.Marland Kitchen. Inf[facility] was not showing feeding cues [facility]. LPT infant policy handout given and explained to parents. Advised parents that infant needs to feed at least every 3 hours for no longer than 30 minutes. Enc family to offer breast with each feeding and then to offer EBM after BF. Discussed with parents that if mom is unable to pump enough supplemental volume of EBM and infant not feeding well that Alimentum will be needed to supplement infant due LPT infant status. Discussed supplementation amounts based on day of age according to LPT infant policy.   Infant began cueing to feed after LC was in the room for 20 minutes. Assisted mom in latching infant to right breast in the football hold. Infant took nipple in mouth and suckled about 20 times and then fell asleep. Mom was shown to hand express and she returned demo obtaining 7-8 large gtts. Discussed with parents that infant can be supplemented with curved tip syringe or bottle and that the bottle may be the best option for LPT infant to conserve calories.   DEBP set up with instructions for use, assembling, disassembling and cleaning of pump parts. Enc mom to pump every 2-3 hours for 15 minutes on initiate setting after BF and to follow with hand expression. Feeding plan was written on white board in room. Mom was very tired and wished to eat and rest prior  to pumping.   Mom has a manual pump at home, dad to call Insurance company to inquire about a DEBP. BF resources handout and LC Brochure given, mom informed of IP/OP Services, BF Support Groups and LC phone #. Enc mom to call out for feeding assistance as needed. Parents without further questions/concerns at this time.     Maternal Data Formula Feeding for Exclusion: No Has patient been taught Hand Expression?: Yes Does the patient have breastfeeding experience prior to this delivery?: No  Feeding Feeding Type: Breast Fed Length of feed: 3 min  LATCH Score/Interventions Latch: Repeated attempts needed to sustain latch, nipple held in mouth throughout feeding, stimulation needed to elicit sucking reflex. Intervention(s): Adjust position;Assist with latch;Breast massage;Breast compression  Audible Swallowing: None Intervention(s): Hand expression;Skin to skin  Type of Nipple: Everted at rest and after stimulation  Comfort (Breast/Nipple): Soft / non-tender     Hold (Positioning): Assistance needed to correctly position infant at breast and maintain latch. Intervention(s): Breastfeeding basics reviewed;Support Pillows;Position options;Skin to skin  LATCH Score: 6  Lactation Tools Discussed/Used WIC Program: No Pump Review: Setup, frequency, and cleaning Initiated by:: Noralee StainSharon Leldon Steege, RN, IBCLC Date initiated:: 05/24/17   Consult Status Consult Status: Follow-up Date: 05/25/17 Follow-up type: In-patient    Stephanie [name] 05/24/2017, 4:50 PM

## 2017-05-24 NOTE — Anesthesia Pain Management Evaluation Note (Signed)
  CRNA Pain Management Visit Note  Patient: Stephanie [name], 26 y.o., female  "Hello I am a [facility] of the anesthesia [facility]. We have an anesthesia team available at all times to provide care throughout the hospital, including epidural management and anesthesia for C-section. I don't know your plan for the delivery whether it a natural birth, water birth, IV sedation, nitrous supplementation, doula or epidural, but we want to meet your pain goals."   1.Was your pain managed to your expectations on prior hospitalizations?   No prior hospitalizations  2.What is your expectation for pain management during this hospitalization?     Epidural  3.How can we help you reach that goal? Epidural in place.  Record the patient's initial score and the patient's pain goal.   Pain: 4--vaginal pressure. Patient reports no pain, just pressure.  Pain Goal: 8 prior to epidural placement. [facility] wants you to be able to say your pain was always managed very well.  Derriona Branscom L 05/24/2017

## 2017-05-24 NOTE — Progress Notes (Signed)
[employer] Interpreter [phone]#[id number] used to answer patient's questions and discuss POC.

## 2017-05-24 NOTE — Anesthesia Preprocedure Evaluation (Signed)
Anesthesia Evaluation  Patient identified by MRN, date of birth, ID band Patient awake    Reviewed: Allergy & Precautions, NPO status , Patient's Chart, lab work & pertinent test results  Airway Mallampati: II  TM Distance: >3 FB Neck ROM: Full    Dental  (+) Teeth Intact, Dental Advisory Given   Pulmonary neg pulmonary ROS,    Pulmonary exam normal breath sounds clear to auscultation       Cardiovascular negative cardio ROS Normal cardiovascular exam Rhythm:Regular Rate:Normal     Neuro/Psych negative neurological ROS     GI/Hepatic negative GI ROS, Neg liver ROS,   Endo/Other  negative endocrine ROS  Renal/GU negative Renal ROS     Musculoskeletal negative musculoskeletal ROS (+)   Abdominal   Peds  Hematology negative hematology ROS (+) Plt 178k   Anesthesia Other Findings Day of surgery medications reviewed with the patient.  Reproductive/Obstetrics (+) Pregnancy                             Anesthesia Physical Anesthesia Plan  ASA: II  Anesthesia Plan: Epidural   Post-op Pain Management:    Induction:   PONV Risk Score and Plan:   Airway Management Planned:   Additional Equipment:   Intra-op Plan:   Post-operative Plan:   Informed Consent: I have reviewed the patients History and Physical, chart, labs and discussed the procedure including the risks, benefits and alternatives for the proposed anesthesia with the patient or authorized representative who has indicated his/her understanding and acceptance.   Dental advisory given  Plan Discussed with:   Anesthesia Plan Comments: (Patient identified. Risks/Benefits/Options discussed with patient including but not limited to bleeding, infection, nerve damage, paralysis, failed block, incomplete pain control, headache, blood pressure changes, nausea, vomiting, reactions to medication both or allergic, itching and postpartum  back pain. Confirmed with bedside nurse the patient's most recent platelet count. Confirmed with patient that they are not currently taking any anticoagulation, have any bleeding history or any family history of bleeding disorders. Patient expressed understanding and wished to proceed. All questions were answered. )        Anesthesia Quick Evaluation

## 2017-05-25 LAB — COMPREHENSIVE METABOLIC PANEL
ALK PHOS: 94 U/L (ref 38–126)
ALT: 12 U/L — AB (ref 14–54)
AST: 23 U/L (ref 15–41)
Albumin: 2.7 g/dL — ABNORMAL LOW (ref 3.5–5.0)
Anion gap: 7 (ref 5–15)
BILIRUBIN TOTAL: 0.6 mg/dL (ref 0.3–1.2)
BUN: 12 mg/dL (ref 6–20)
CO2: 24 mmol/L (ref 22–32)
CREATININE: 0.54 mg/dL (ref 0.44–1.00)
Calcium: 9.2 mg/dL (ref 8.9–10.3)
Chloride: 105 mmol/L (ref 101–111)
GFR calc Af Amer: >60 mL/min (ref >60)
Glucose, Bld: 138 mg/dL — ABNORMAL HIGH (ref 65–99)
Potassium: 3.8 mmol/L (ref 3.5–5.1)
Sodium: 136 mmol/L (ref 135–145)
TOTAL PROTEIN: 5.2 g/dL — AB (ref 6.5–8.1)

## 2017-05-25 NOTE — Lactation Note (Signed)
This note was copied from a baby's chart. Lactation Consultation Note  [employer] [facility] Interpreter [phone]#[id number] for Mandarin. FOB spoke english. Discussed feeding baby 8-12 times/24 hours and with feeding cues at least q 3 hours. FOB stated they are following the plan with the exception of last night when the baby left the room for 3 hours for a hearing screen and came back crying and hungry. FOB asked if that was going to happen again tonight when they re-screen.  Spoke with Jan RN and she will talked to FOB and allow him to attend next hearing screen. Baby 33 hours old. 1937w3d.  Family is following LPI feeding plan. Mother is pumping 10-15 ml and giving volume back to baby in addition to breastfeeding. Suggest call for assistance if needed tonight.   Patient Name: Stephanie [name] BJYNW'GToday's Date: 05/25/2017      Maternal Data    Feeding Feeding Type: Bottle Fed - Breast Milk  LATCH Score/Interventions                      Lactation Tools Discussed/Used     Consult Status      Hardie PulleyBerkelhammer, Stephanie [name] 05/25/2017, 10:13 PM

## 2017-05-25 NOTE — Progress Notes (Signed)
FOB concerned when mother tested positive for Hep B.  Dr Howard PouchLauren Feng came to speak with parents about further testing and answer questions they had about the result. Charge nurse Metta Clinesatherine Lawrence spoke with lab and followup tests were ordered for mom.  Jtwells, rn

## 2017-05-25 NOTE — Progress Notes (Signed)
POSTPARTUM PROGRESS NOTE  Post Partum Day #1 s/p VAVD with 2nd degree laceration with midline episiotomy  Subjective:  Stephanie [name] is a 26 y.o. G1P0101 6566w3d s/p VAVD.  No acute events overnight.  Pt denies problems with ambulating, voiding or po intake.  She denies nausea or vomiting.  Pain is well controlled.  She has not had flatus. She has not had bowel movement.  Lochia Minimal.   Objective: Blood pressure (!) 105/52, pulse 83, temperature 98 F (36.7 C), temperature source Oral, resp. rate 18, height 5' 3.5" (1.613 m), weight 75.8 kg (167 lb), last menstrual period 09/11/2016, SpO2 98 %, unknown if currently breastfeeding.  Physical Exam:  General: alert, cooperative and no distress Lochia:normal flow Uterine Fundus: firm,  DVT Evaluation: No calf swelling or tenderness Extremities: no LE edema   Recent Labs  05/23/17 2115  HGB 13.0  HCT 37.7    Assessment/Plan:  ASSESSMENT: Stephanie [name] is a 26 y.o. G1P0101 7266w3d s/p VAVD.  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     Ernestina PennaNicholas Schenk, MD 9:31 AM

## 2017-05-25 NOTE — Anesthesia Postprocedure Evaluation (Signed)
Anesthesia Post Note  Patient: Stephanie [name]  Procedure(s) Performed: * No procedures listed *     Patient location during evaluation: Mother Baby Anesthesia Type: Epidural Level of consciousness: awake Pain management: pain level controlled Vital Signs Assessment: post-procedure vital signs reviewed and stable Respiratory status: spontaneous breathing Cardiovascular status: stable Postop Assessment: no headache, no backache, epidural receding, patient able to bend at knees, no signs of nausea or vomiting and adequate PO intake Anesthetic complications: no    Last Vitals:  Vitals:   05/24/17 1945 05/25/17 0609  BP: 103/64 (!) 105/52  Pulse: 99 83  Resp: 20 18  Temp: 37 C 36.7 C    Last Pain:  Vitals:   05/25/17 0609  TempSrc: Oral  PainSc:    Pain Goal:                 Stephanie [name]

## 2017-05-26 LAB — HEPATITIS B SURFACE ANTIBODY,QUALITATIVE: HEP B S AB: NONREACTIVE

## 2017-05-26 LAB — HEPATITIS B CORE ANTIBODY, TOTAL: HEP B C TOTAL AB: POSITIVE — AB

## 2017-05-26 LAB — HEPATITIS C ANTIBODY

## 2017-05-26 LAB — HEPATITIS B CORE ANTIBODY, IGM: HEP B C IGM: NEGATIVE

## 2017-05-26 LAB — HEPATITIS B E ANTIGEN: HEP B E AG: NEGATIVE

## 2017-05-26 MED ORDER — IBUPROFEN 600 MG PO TABS: 600.0000 mg | ORAL_TABLET | Freq: Four times a day (QID) | ORAL | 0 refills | Status: DC

## 2017-05-26 MED ORDER — SENNOSIDES-DOCUSATE SODIUM 8.6-50 MG PO TABS: 1.0000 | ORAL_TABLET | Freq: Every evening | ORAL | 0 refills | Status: DC | PRN

## 2017-05-26 NOTE — Discharge Instructions (Signed)

## 2017-05-26 NOTE — Discharge Summary (Signed)
OB Discharge Summary     Patient Name: Stephanie [name] DOB: May 30, 1991 MRN: [id number]  Date of admission: 05/23/2017 Delivering MD: Jen Mow [facility]   Date of discharge: 05/26/2017  Admitting diagnosis: 36 wk water broke Intrauterine pregnancy: [redacted]w[redacted]d     Secondary diagnosis:  Active Problems:   Normal labor  Additional problems:  Patient Active Problem List   Diagnosis Date Noted  . Normal labor 05/23/2017  . Supervision of low-risk pregnancy, second trimester 01/30/2017  . Language barrier affecting health care 01/30/2017  - Positive hepatitis B antigen     Discharge diagnosis: Preterm Pregnancy Delivered, Hepatitis B surface Ag Pos                                                                                            Augmentation: Pitocin  Complications: None  Hospital course:  Onset of Labor With Vaginal Delivery     26 y.o. yo G1P0101 at [redacted]w[redacted]d was admitted with rupture of membranes on 05/23/2017. Patient had an uncomplicated labor course as follows:  Membrane Rupture Time/Date: 7:00 PM ,05/23/2017   Intrapartum Procedures: Episiotomy: Median [2]                                         Lacerations:     Patient had a delivery of a Viable infant. 05/24/2017  Information for the patient's newborn:  Ronisha, [place] [[id number]]  Delivery Method: Vaginal, Vacuum (Extractor) (Filed from Delivery Summary)    Pateint had an uncomplicated postpartum course.  She is ambulating, tolerating a regular diet, passing flatus, and urinating well. Patient is discharged home in stable condition on 05/26/17.   Physical exam  Vitals:   05/24/17 1945 05/25/17 0609 05/25/17 1812 05/26/17 0500  BP: 103/64 (!) 105/52 106/62 (!) 102/58  Pulse: 99 83 86 80  Resp: 20 18 18 18   Temp: 98.6 F (37 C) 98 F (36.7 C) 98.4 F (36.9 C) 98.2 F (36.8 C)  TempSrc: Oral Oral    SpO2: 98%   98%  Weight:      Height:       General: alert, cooperative and no distress Lochia:  appropriate Uterine Fundus: firm Incision: N/A DVT Evaluation: No evidence of DVT seen on physical exam. Labs: Lab Results  Component Value Date   WBC 9.7 05/23/2017   HGB 13.0 05/23/2017   HCT 37.7 05/23/2017   MCV 91.5 05/23/2017   PLT 178 05/23/2017   CMP Latest Ref Rng & Units 05/25/2017  Glucose 65 - 99 mg/dL 782(N)  BUN 6 - 20 mg/dL 12  Creatinine 5.62 - 1.30 mg/dL 8.65  Sodium 784 - 696 mmol/L 136  Potassium 3.5 - 5.1 mmol/L 3.8  Chloride 101 - 111 mmol/L 105  CO2 22 - 32 mmol/L 24  Calcium 8.9 - 10.3 mg/dL 9.2  Total Protein 6.5 - 8.1 g/dL 5.2(L)  Total Bilirubin 0.3 - 1.2 mg/dL 0.6  Alkaline Phos 38 - 126 U/L 94  AST 15 - 41 U/L 23  ALT 14 -  54 U/L 12(L)    Discharge instruction: per After Visit Summary and "Baby and Me Booklet".  After visit meds:  Allergies as of 05/26/2017   No Known Allergies     Medication List    STOP taking these medications   Butalbital-APAP-Caffeine 50-325-40 MG capsule     TAKE these medications   ibuprofen 600 MG tablet Commonly known as:  ADVIL,MOTRIN Take 1 tablet (600 mg total) by mouth every 6 (six) hours.   Prenatal Vitamins 0.8 MG tablet Take 1 tablet by mouth daily.   senna-docusate 8.6-50 MG tablet Commonly known as:  Senokot-S Take 1 tablet by mouth at bedtime as needed for mild constipation.       Diet: routine diet  Activity: Advance as tolerated. Pelvic rest for 6 weeks.   Outpatient follow up:6 weeks Follow up Appt: Future Appointments Date Time Provider Department Center  05/28/2017 7:40 AM Marylene LandKooistra, Kathryn Lorraine, CNM [facility]  06/03/2017 7:40 AM Dorathy KinsmanSmith, [place], CNM [facility]  06/10/2017 8:00 AM Katrinka BlazingSmith, [place], CNM [facility]  06/18/2017 9:20 AM Donette LarryBhambri, Melanie, CNM [facility]   Follow up Visit:No Follow-up on file.  Postpartum contraception: Progesterone only pills  Newborn Data: Live born female  Birth Weight: 7 lb 1.8 oz (3225 g) APGAR: 9, 9  Baby Feeding:  Breast Disposition:home with mother    Hep B - Hep B Surface antigen positive - Hep B panel including HepE antigen and antibody, core antibody, surface antibody, as well as Hep C antibody labs ordered and pending - LFTs WNL - Please follow up results of Hep B panel at patient's follow up appointment   05/26/2017 Howard PouchLauren Feng, MD  OB FELLOW DISCHARGE ATTESTATION  I confirm that I have verified the information documented in the resident's note and that I have also personally reperformed the physical exam and all medical decision making activities.     Ernestina PennaNicholas Schenk, MD 8:05 AM

## 2017-05-26 NOTE — Lactation Note (Signed)
This note was copied from a baby's chart. Lactation Consultation Note  Patient Name: Stephanie Stephanie [name]: 05/26/2017 Reason for consult: Follow-up assessment Baby at 44 hr of life and dyad set for d/c today. Mom plans to continue bf. She would like Dad to be present for consult. She called him to come back to the hospital. She is unsure if she would like to schedule an OP apt, she would like to ask Dad. Instructed mom to call for lactation when he returns.   Maternal Data    Feeding Feeding Type: Bottle Fed - Breast Milk Length of feed: 60 min  LATCH Score/Interventions                      Lactation Tools Discussed/Used     Consult Status Consult Status: Follow-up Stephanie [name]: 05/26/17 Follow-up type: In-patient    Stephanie Stephanie [name] 05/26/2017, 9:17 AM

## 2017-05-26 NOTE — Lactation Note (Signed)
This note was copied from a baby's chart. Lactation Consultation Note  Patient Name: Stephanie [name] UEAVW'UToday's Date: 05/26/2017 Reason for consult: Follow-up assessment Baby at 45 hr of life. Dad is concerned about the hearing screen and mom's Hep B status. Reviewed LPT feeding policy with parents and a family member. Assisted with latching baby in cross cradle on the R breast. Mom needs reminders to keep baby close while feeding. Mom denies nipple pain, but Dad reports mom's breast are "getting hard and needed massage all night". Parents plan to get DEBP on the way home today. Mom will offer the breast on demand 8+/24hr, post pump, and offer supplement per volume guidelines. If mom does not have expressed milk she will offer formula. Dad declined OP lactation apt. They plan to f[facility] with [facility] Peds office, [facility], and [facility]. Parents have lactation contact information if they need help once they go home.   Maternal Data    Feeding Feeding Type: Breast Fed Length of feed: 20 min  LATCH Score/Interventions Latch: Grasps breast easily, tongue down, lips flanged, rhythmical sucking. Intervention(s): Adjust position;Assist with latch;Breast compression  Audible Swallowing: Spontaneous and intermittent Intervention(s): Skin to skin;Hand expression Intervention(s): Alternate breast massage  Type of Nipple: Everted at rest and after stimulation  Comfort (Breast/Nipple): Filling, red/small blisters or bruises, mild/mod discomfort  Problem noted: Mild/Moderate discomfort Interventions (Mild/moderate discomfort): Post-pump;Hand expression  Hold (Positioning): Full assist, staff holds infant at breast Intervention(s): Position options;Support Pillows  LATCH Score: 7  Lactation Tools Discussed/Used     Consult Status Consult Status: Complete Date: 05/26/17 Follow-up type: Call as needed    Stephanie [name] 05/26/2017, 10:33 AM

## 2017-05-26 NOTE — Plan of Care (Signed)
Problem: Health Behavior/Discharge Planning: Goal: Ability to manage health-related needs will improve Discharge education, safety and follow up reviewed with patient and significant other with [employer] interpreter "Trula OreChristina" # [phone]. Patient and significant other verbalize understanding of information.

## 2017-05-27 ENCOUNTER — Ambulatory Visit: Payer: Self-pay

## 2017-05-27 LAB — HEPATITIS B E ANTIBODY: Hep B E Ab: POSITIVE — AB

## 2017-05-27 NOTE — Lactation Note (Signed)
This note was copied from a baby's chart. Lactation Consultation Note  Patient Name: Stephanie Nickolas MadridZuli Julie Scioli WUJWJ'XToday's Date: 05/27/2017 Reason for consult: Follow-up assessment Baby at 68 hr of life and placed on dbl photo this am. Dyad will not be going home today per RN. Mom reports she bf at every cue and f/u with either expressed milk or formula. She denies breast or nipple pain. Baby is latching without the NS and she is using the DEBP q3hr or after feedings. Right before this visit baby was placed on the lights and mom was struggling to change is his diaper and swaddle him. Dad was sleeping. Mom will continue to offer the breast on demand q3hr, post express, and offer supplement per volume guidelines. She will call lactation at the next feeding for a latch check. She is aware of lactation services and support group.    Maternal Data    Feeding Feeding Type: Breast Fed  LATCH Score/Interventions                      Lactation Tools Discussed/Used     Consult Status Consult Status: Follow-up Date: 05/28/17 Follow-up type: In-patient    Rulon Eisenmengerlizabeth E Kaylyn Garrow 05/27/2017, 9:38 AM

## 2017-05-28 ENCOUNTER — Telehealth: Payer: Self-pay | Admitting: *Deleted

## 2017-05-28 ENCOUNTER — Encounter: Payer: BLUE CROSS/BLUE SHIELD | Admitting: Student

## 2017-05-28 ENCOUNTER — Ambulatory Visit: Payer: Self-pay

## 2017-05-28 NOTE — Telephone Encounter (Signed)
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

## 2017-05-28 NOTE — Lactation Note (Signed)
This note was copied from a baby's chart. Lactation Consultation Note  Patient Name: Boy Nickolas MadridZuli Julie Dunstan ZOXWR'UToday's Date: 05/28/2017   Visited with Mom, baby 9093 hrs old and under double phototherapy for one more day. Baby at 3% weight loss, feeding exclusively bottles as too sleepy to breastfeed more than a few sucks.  Mom has been pumping every 3 hrs, obtaining 50-60 ml of breast milk now.  Baby feeding at least every 3 hrs, explained importance of this to Mom.  No problems at present.  To ask for assistance as needed. Lactation to follow up prn and daily.   Judee ClaraSmith, Roneshia Drew E 05/28/2017, 10:37 AM

## 2017-05-28 NOTE — Telephone Encounter (Signed)
[facility], call back to [phone] during business hours.

## 2017-05-29 ENCOUNTER — Ambulatory Visit: Payer: Self-pay

## 2017-05-29 NOTE — Lactation Note (Signed)
This note was copied from a baby's chart. Lactation Consultation Note: Mother was scheduled for follow OP visit on July 3 at 11:30, Parents asking how do they know infant is getting enough from the breast. Mother informed of breast filling full and soft after feeding , observing swallows and keeping accurate account of wet and dirty diapers.  Mothers breast are full now. She has a electric pump at home. She plans to continue to breastfeed infant and then supplement infant with EBM. Discussed cluster feeding and growth spurts . Mother receptive to all teaching . She is aware of available LC services.   Patient Name: Stephanie Nickolas MadridZuli Julie Judy EAVWU'JToday's Date: 05/29/2017 Reason for consult: Follow-up assessment   Maternal Data    Feeding Feeding Type: Formula  LATCH Score/Interventions                      Lactation Tools Discussed/Used     Consult Status Consult Status: Follow-up Date: 06/03/17 Follow-up type: Out-patient    Stevan BornKendrick, Kateryn Marasigan [facility] 05/29/2017, 11:54 AM

## 2017-05-30 NOTE — Telephone Encounter (Signed)
[facility], they are going to fax paperwork to us, we need to fill out and send back.

## 2017-06-03 ENCOUNTER — Telehealth: Payer: Self-pay | Admitting: Lab

## 2017-06-03 ENCOUNTER — Encounter: Payer: BLUE CROSS/BLUE SHIELD | Admitting: Advanced Practice Midwife

## 2017-06-03 NOTE — Telephone Encounter (Signed)
Open in error

## 2017-06-07 ENCOUNTER — Encounter: Payer: Self-pay | Admitting: Obstetrics and Gynecology

## 2017-06-08 ENCOUNTER — Encounter: Payer: Self-pay | Admitting: Obstetrics and Gynecology

## 2017-06-09 NOTE — Telephone Encounter (Signed)
Patient send email stating she saw + Hep B on My Chart and wants to know if she needs treatment. I have sent her an email we are in the process of making a referral and will get back to her.

## 2017-06-10 ENCOUNTER — Encounter: Payer: BLUE CROSS/BLUE SHIELD | Admitting: Advanced Practice Midwife

## 2017-06-12 ENCOUNTER — Encounter: Payer: Self-pay | Admitting: Obstetrics and Gynecology

## 2017-06-13 LAB — HEPATITIS B SURFACE ANTIBODY,QUALITATIVE: HEP B S AB: NONREACTIVE

## 2017-06-18 ENCOUNTER — Encounter: Payer: BLUE CROSS/BLUE SHIELD | Admitting: Certified Nurse Midwife

## 2017-06-19 ENCOUNTER — Other Ambulatory Visit: Payer: Self-pay | Admitting: General Practice

## 2017-06-19 DIAGNOSIS — B181 Chronic viral hepatitis B without delta-agent: Secondary | ICD-10-CM

## 2017-07-01 ENCOUNTER — Encounter: Payer: Self-pay | Admitting: Certified Nurse Midwife

## 2017-07-01 ENCOUNTER — Ambulatory Visit (INDEPENDENT_AMBULATORY_CARE_PROVIDER_SITE_OTHER): Payer: BLUE CROSS/BLUE SHIELD | Admitting: Certified Nurse Midwife

## 2017-07-01 VITALS — BP 118/68 | HR 66 | Ht 64.17 in | Wt 157.0 lb

## 2017-07-01 DIAGNOSIS — N898 Other specified noninflammatory disorders of vagina: Secondary | ICD-10-CM | POA: Diagnosis not present

## 2017-07-01 DIAGNOSIS — Z113 Encounter for screening for infections with a predominantly sexual mode of transmission: Secondary | ICD-10-CM | POA: Diagnosis not present

## 2017-07-01 DIAGNOSIS — R300 Dysuria: Secondary | ICD-10-CM | POA: Diagnosis not present

## 2017-07-01 DIAGNOSIS — Z1389 Encounter for screening for other disorder: Secondary | ICD-10-CM | POA: Diagnosis not present

## 2017-07-01 LAB — POCT URINALYSIS DIP (DEVICE)
Bilirubin Urine: NEGATIVE
Glucose, UA: NEGATIVE mg/dL
HGB URINE DIPSTICK: NEGATIVE
KETONES UR: NEGATIVE mg/dL
Nitrite: NEGATIVE
PROTEIN: NEGATIVE mg/dL
SPECIFIC GRAVITY, URINE: 1.02 (ref 1.005–1.030)
UROBILINOGEN UA: 0.2 mg/dL (ref 0.0–1.0)
pH: 5.5 (ref 5.0–8.0)

## 2017-07-01 NOTE — Progress Notes (Signed)
Subjective:     Stephanie [name] is a 26 y.o. female who presents for a postpartum visit. She is 5 weeks postpartum following a spontaneous vaginal delivery. I have fully reviewed the prenatal and intrapartum course. The delivery was at 36.3 gestational weeks. Outcome: Vacuum. Anesthesia: epidural. Postpartum course has been unremarkable. Baby's course has been unremarkable. Baby is feeding by breast. Bleeding no bleeding. Bowel function is normal. Bladder function is normal. Patient is not sexually active. Contraception method is condoms, [place], will use condoms for now then something else once she returns. Depression/anxiety screening: negative.  Reports burning with urination and lite yellow malodorous vaginal discharge.  The following portions of the patient's history were reviewed and updated as appropriate: allergies, current medications, past family history, past medical history, past social history, past surgical history and problem list.  Review of Systems Pertinent items are noted in HPI.   Objective:    BP 118/68   Pulse 66   Ht 5' 4.17" (1.63 m)   Wt 157 lb (71.2 kg)   Breastfeeding? Yes   BMI 26.80 kg/m   General:  alert, cooperative and no distress   Breasts:    Lungs: regular rate and effort  Heart:  reg rate  Abdomen:    Vulva:  normal  Vagina: vagina positive for malodorous lite yellow discharge, epis well healed  Cervix:    Corpus:   Adnexa:    Rectal Exam:        Neuro: grossly intact Assessment:      1. Normal postpartum exam. Pap smear not done at today's visit.  2. Vaginal discharge 3. Dysuria  Plan:      1. Contraception: condoms 2. Wet prep 3. UA and UC 4. Follow up in: 1 year or as needed.

## 2017-07-02 ENCOUNTER — Other Ambulatory Visit (HOSPITAL_COMMUNITY): Payer: Self-pay | Admitting: Nurse Practitioner

## 2017-07-02 DIAGNOSIS — R748 Abnormal levels of other serum enzymes: Secondary | ICD-10-CM

## 2017-07-02 DIAGNOSIS — K7400 Hepatic fibrosis, unspecified: Secondary | ICD-10-CM

## 2017-07-02 LAB — CERVICOVAGINAL ANCILLARY ONLY
BACTERIAL VAGINITIS: NEGATIVE
CANDIDA VAGINITIS: NEGATIVE
TRICH (WINDOWPATH): NEGATIVE

## 2017-07-03 LAB — URINE CULTURE

## 2017-07-14 ENCOUNTER — Ambulatory Visit (HOSPITAL_COMMUNITY): Admission: RE | Admit: 2017-07-14 | Payer: BLUE CROSS/BLUE SHIELD | Source: Ambulatory Visit

## 2017-12-19 IMAGING — US US MFM OB COMP +14 WKS
1 series · 14 of 28 positions shown · non-contrast
Comparison: none

[Series 1: us mfm ob comp +14 wks · 14 of 117 slices shown]
[im 5/117]
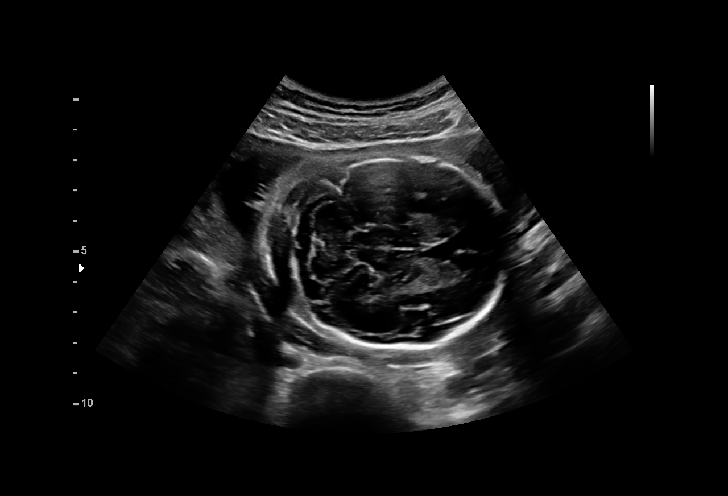
[im 13/117]
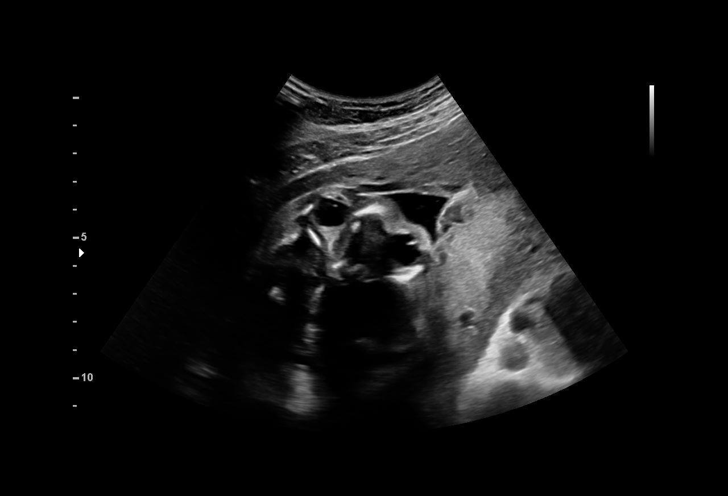
[im 22/117]
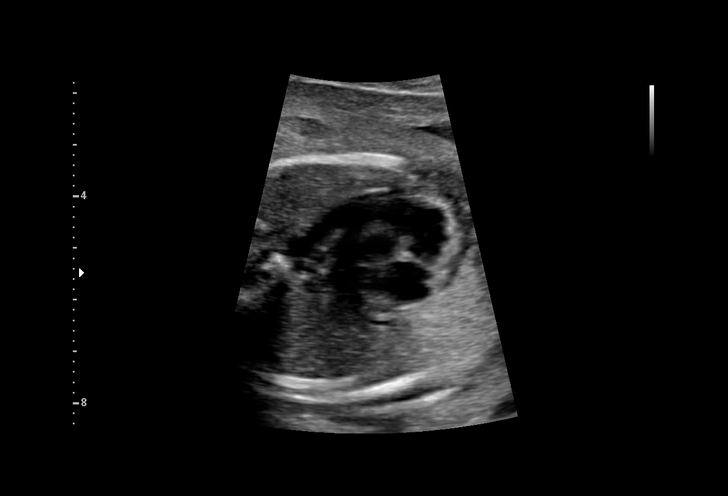
[im 31/117]
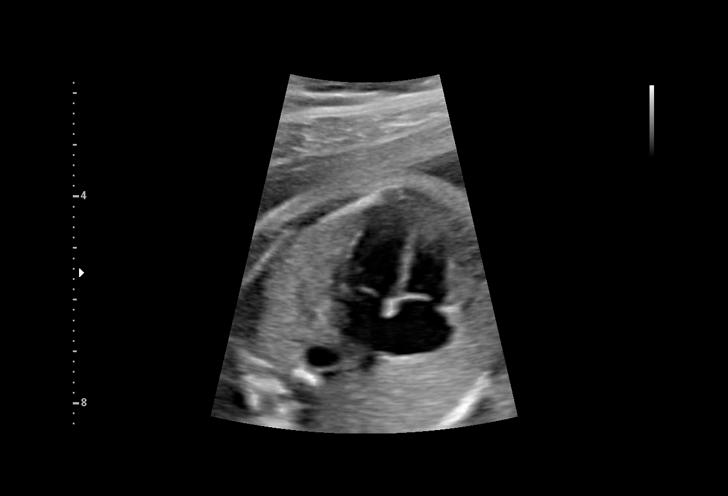
[im 39/117]
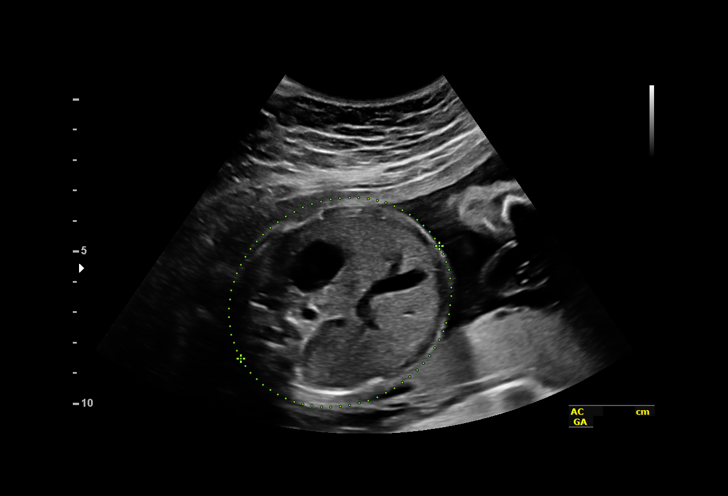
[im 48/117]
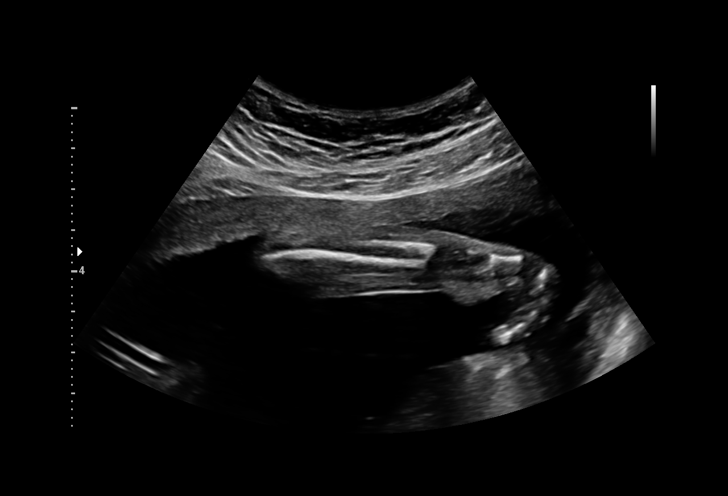
[im 56/117]
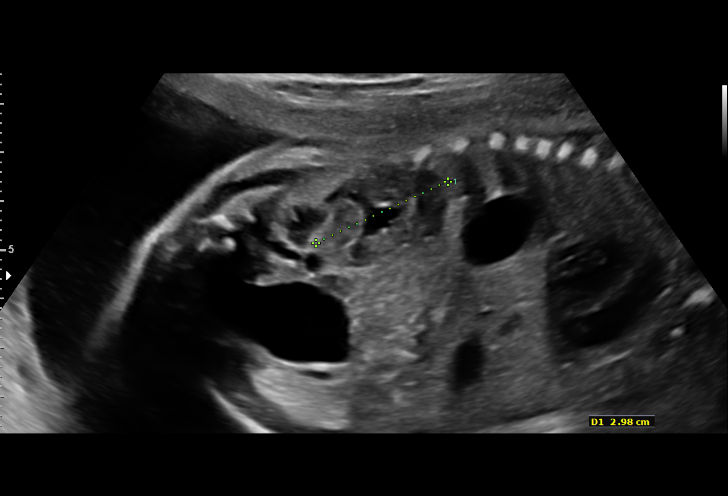
[im 65/117]
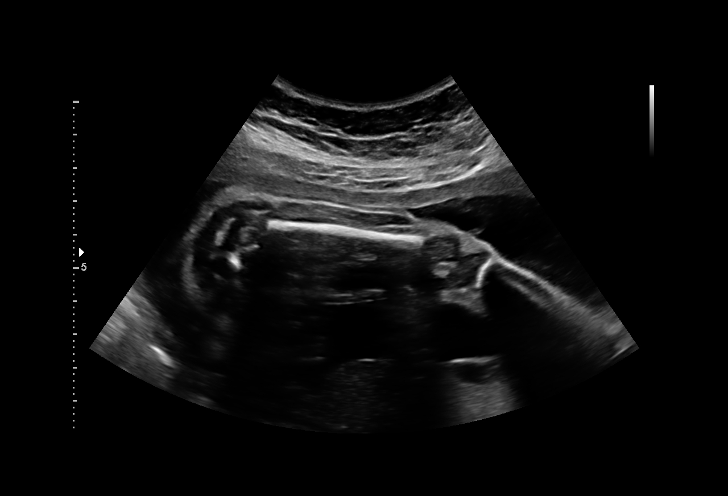
[im 74/117]
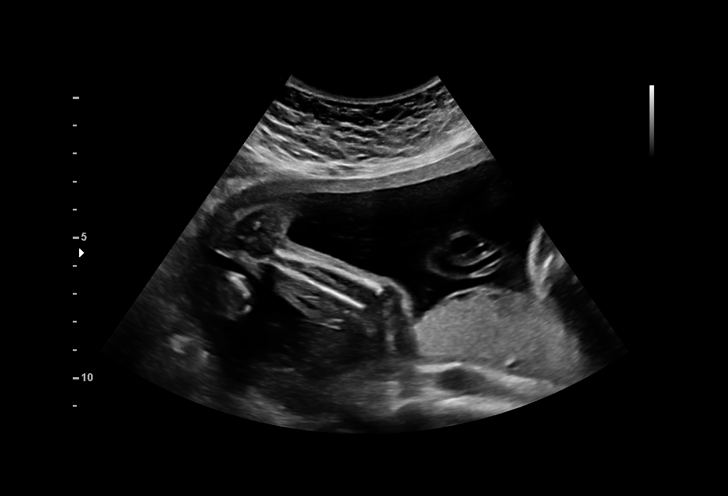
[im 82/117]
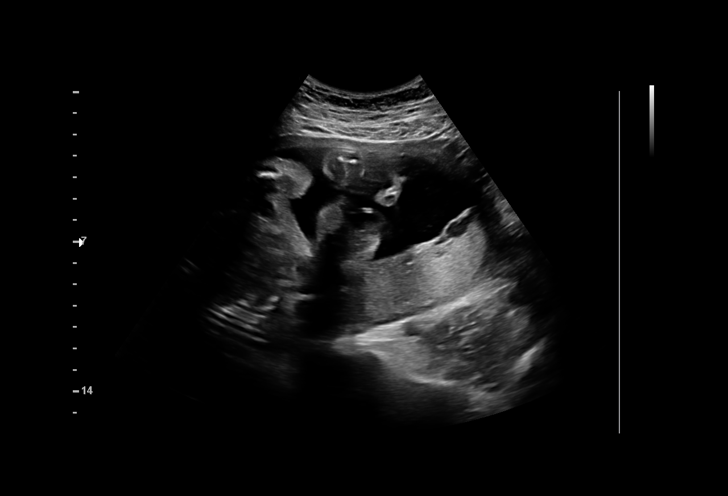
[im 91/117]
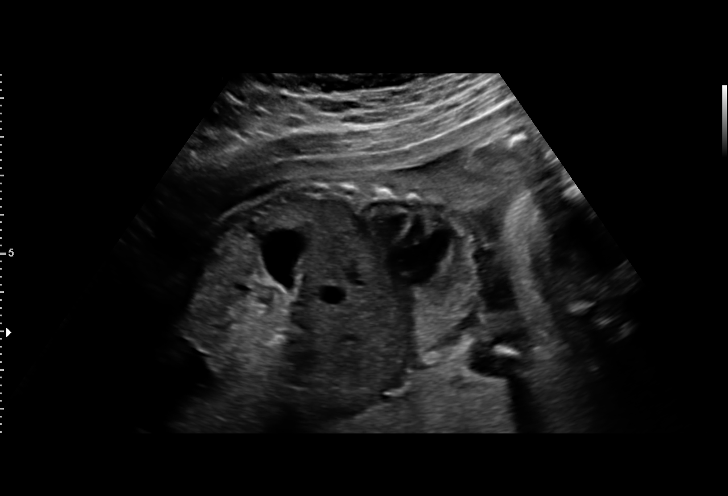
[im 99/117]
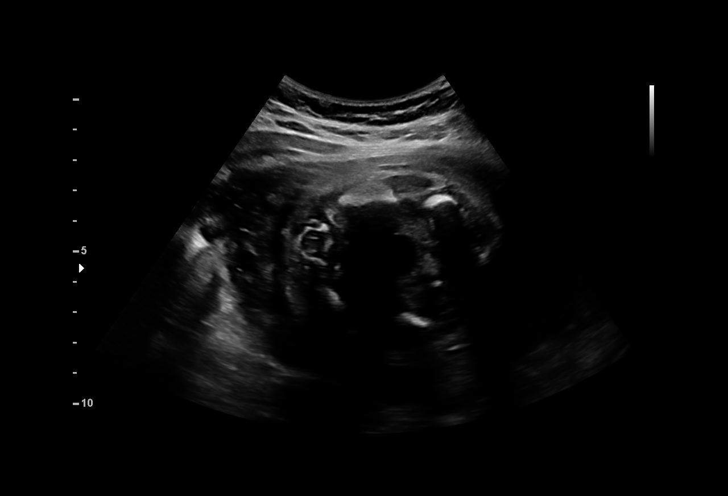
[im 108/117]
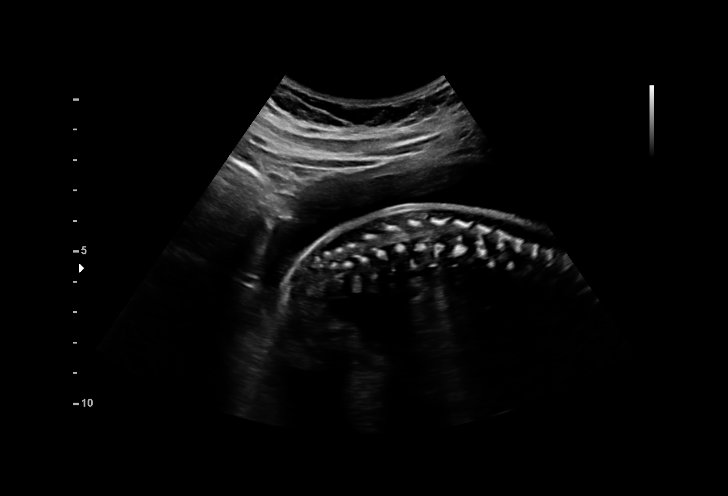
[im 117/117]
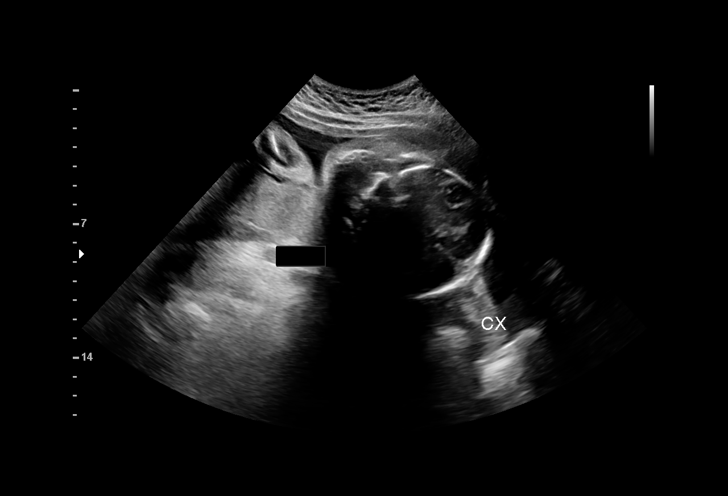

[14 of 28 positions shown; findings below may reference images not displayed]

1  BONITA PANDIT          [id number]      [id number]     [id number]
Indications

25 weeks gestation of pregnancy
Encounter for fetal anatomic survey
OB History

Blood Type:            Height:  5'3"   Weight (lb):  145       BMI:
Gravidity:    1         Term:   0        Prem:   0        SAB:   0
TOP:          0       Ectopic:  0        Living: 0
Fetal Evaluation

Num Of Fetuses:     1
Fetal Heart         138
Rate(bpm):
Cardiac Activity:   Observed
Presentation:       Cephalic
Placenta:           Posterior, above cervical os
P. Cord Insertion:  Visualized, central

Amniotic Fluid
AFI FV:      Subjectively within normal limits

Largest Pocket(cm)
5.27
Biometry

BPD:      62.3  mm     G. Age:  25w 2d         52  %    CI:        69.82   %    70 - 86
FL/HC:       20.0  %    18.7 -
HC:      237.9  mm     G. Age:  25w 6d         59  %    HC/AC:       1.07       1.04 -
AC:      221.7  mm     G. Age:  26w 4d         85  %    FL/BPD:      76.4  %    71 - 87
FL:       47.6  mm     G. Age:  26w 0d         65  %    FL/AC:       21.5  %    20 - 24
HUM:      45.1  mm     G. Age:  26w 5d         85  %
CER:      28.5  mm     G. Age:  25w 4d         58  %
LV:        4.7  mm
CM:        4.9  mm
Est. FW:     906   gm          2 lb     76  %
Gestational Age

LMP:           25w 0d        Date:  09/11/16                 EDD:   06/18/17
U/S Today:     26w 0d                                        EDD:   06/11/17
Best:          25w 0d     Det. By:  LMP  (09/11/16)          EDD:   06/18/17
Anatomy

Cranium:               Appears normal         Aortic Arch:            Appears normal
Cavum:                 Appears normal         Ductal Arch:            Appears normal
Ventricles:            Appears normal         Diaphragm:              Appears normal
Choroid Plexus:        Appears normal         Stomach:                Appears normal, left
sided
Cerebellum:            Appears normal         Abdomen:                Appears normal
Posterior Fossa:       Appears normal         Abdominal Wall:         Appears nml (cord
insert, abd wall)
Nuchal Fold:           Not applicable (>20    Cord Vessels:           Appears normal (3
wks GA)                                        vessel cord)
Face:                  Appears normal         Kidneys:                Appear normal
(orbits and profile)
Lips:                  Appears normal         Bladder:                Appears normal
Thoracic:              Appears normal         Spine:                  Limited views
appear normal
Heart:                 Appears normal         Upper Extremities:      Appears normal
(4CH, axis, and situs
RVOT:                  Appears normal         Lower Extremities:      Appears normal
LVOT:                  Appears normal

Other:  Fetus appears to be a male.
Cervix Uterus Adnexa

Cervix
Length:           3.54  cm.
Normal appearance by transabdominal scan.

Uterus
No abnormality visualized.

Left Ovary
Not visualized.

Right Ovary
Not visualized.
Impression

SIUP at 25+0 weeks
Normal detailed fetal anatomy
Normal amniotic fluid volume
Measurements consistent with LMP dating; EFW at the 76th
%tile
Recommendations

Follow-up as clinically indicated

## 2018-03-12 ENCOUNTER — Other Ambulatory Visit: Payer: Self-pay | Admitting: Nurse Practitioner

## 2018-03-12 DIAGNOSIS — B181 Chronic viral hepatitis B without delta-agent: Secondary | ICD-10-CM

## 2018-03-23 ENCOUNTER — Ambulatory Visit
Admission: RE | Admit: 2018-03-23 | Discharge: 2018-03-23 | Disposition: A | Discharge status: Home / Self Care | Payer: BLUE CROSS/BLUE SHIELD | Source: Ambulatory Visit | Attending: Nurse Practitioner | Admitting: Nurse Practitioner

## 2018-03-23 DIAGNOSIS — B181 Chronic viral hepatitis B without delta-agent: Secondary | ICD-10-CM

## 2018-04-03 ENCOUNTER — Encounter: Payer: Self-pay | Admitting: Primary Care

## 2018-04-03 ENCOUNTER — Ambulatory Visit: Payer: BLUE CROSS/BLUE SHIELD | Admitting: Primary Care

## 2018-04-03 VITALS — BP 108/70 | HR 57 | Temp 97.8°F | Ht 63.0 in | Wt 146.2 lb

## 2018-04-03 DIAGNOSIS — Z23 Encounter for immunization: Secondary | ICD-10-CM | POA: Diagnosis not present

## 2018-04-03 DIAGNOSIS — Z0184 Encounter for antibody response examination: Secondary | ICD-10-CM | POA: Diagnosis not present

## 2018-04-03 DIAGNOSIS — Z7689 Persons encountering health services in other specified circumstances: Secondary | ICD-10-CM

## 2018-04-03 NOTE — Assessment & Plan Note (Signed)
Never received. Administered dose one today. Repeat in 2 months with second dose, then 4 months later for final dose. Discussed this with patient.

## 2018-04-03 NOTE — Addendum Note (Signed)
Addended by: Tawnya Crook on: 04/03/2018 09:53 AM   Modules accepted: Orders

## 2018-04-03 NOTE — Progress Notes (Signed)
Subjective:    Patient ID: Stephanie [name], female    DOB: 1991/05/22, 27 y.o.   MRN: [id number]  HPI  Stephanie [name] is a 27 year old female who presents today to establish care and discuss the problems mentioned below.   1) Immunizations: She would like to receive the HPV vaccinations. She's never received them before. She is from [place] and had most of her vaccinations in [place]. She recently saw on the news that numerous vaccination provided were "fake" and is concerned. She received a Tdap in the [place] during her recent pregnancy.   Review of Systems  Respiratory: Negative for shortness of breath.   Cardiovascular: Negative for chest pain.  Genitourinary: Negative for menstrual problem.  Neurological: Negative for dizziness and headaches.       Past Medical History:  Diagnosis Date  . Hepatitis B    postpartum     Social History   Socioeconomic History  . Marital status: Married    Spouse name: Not on file  . Number of children: Not on file  . Years of education: Not on file  . Highest education level: Not on file  Occupational History  . Not on file  Social Needs  . Financial resource strain: Not on file  . Food insecurity:    Worry: Not on file    Inability: Not on file  . Transportation needs:    Medical: Not on file    Non-medical: Not on file  Tobacco Use  . Smoking status: Never Smoker  . Smokeless tobacco: Never Used  Substance and Sexual Activity  . Alcohol use: Yes    Comment: occasional, before pregnancy  . Drug use: No  . Sexual activity: Yes  Lifestyle  . Physical activity:    Days per week: Not on file    Minutes per session: Not on file  . Stress: Not on file  Relationships  . Social connections:    Talks on phone: Not on file    Gets together: Not on file    Attends religious service: Not on file    Active member of club or organization: Not on file    Attends meetings of clubs or organizations: Not on file    Relationship status: Not on file  .  Intimate partner violence:    Fear of current or ex partner: Not on file    Emotionally abused: Not on file    Physically abused: Not on file    Forced sexual activity: Not on file  Other Topics Concern  . Not on file  Social History Narrative   From [place].   Married.   Masters from [employer].   Works as Futures trader    Past Surgical History:  Procedure Laterality Date  . NO PAST SURGERIES      No family history on file.  No Known Allergies  No current outpatient medications on file prior to visit.   No current facility-administered medications on file prior to visit.     BP 108/70   Pulse (!) 57   Temp 97.8 F (36.6 C) (Oral)   Ht  (1.6 m)   Wt 146 lb 4 oz (66.3 kg)   LMP 03/22/2018   SpO2 99%   BMI 25.91 kg/m    Objective:   Physical Exam  Constitutional: She appears well-nourished.  Neck: Neck supple.  Cardiovascular: Normal rate and regular rhythm.  Pulmonary/Chest: Effort normal and breath sounds normal.  Skin: Skin is warm and dry.  Psychiatric: She has a normal mood and affect.          Assessment & Plan:

## 2018-04-03 NOTE — Patient Instructions (Addendum)
12    HPV?????  ??2????????????????????  Plesae???4????????????????????????????????????6???  ?????????MMR?????????  ??????? Nn j?nti?n ji?shule d y?c HPV ymio ji?zh?ng.  Q?ng zi 2 g yu ni ?npi hsh f?ngwn y? ji?shu d r c ymio ji?zh?ng.  Plesae jhu zi 4 g yu hu zic ji?shu hsh f?ngwn y? ji?shu nn de zuhu y? c ymio ji?zh?ng. Zh ji?ng sh nn zuch? ji?zh?ng ymio hu 6 g yu.  Zi shyn sh tng xili csh MMR ymio ji?zh?ng de mi?ny l.  H?n g?oxng jin do n?!   You received your first HPV vaccination today.   Please schedule a nurse visit in 2 months to receive your second vaccination.   Plesae schedule another nurse visit 4 months later to receive your last vaccination. This will be 6 months from your initial vaccination.   Stop by the lab to test immunity for MMR vaccination.  It was very nice meeting you!

## 2018-04-06 LAB — MEASLES/MUMPS/RUBELLA IMMUNITY
Mumps IgG: 284 AU/mL
RUBELLA: 9.83 {index}
RUBEOLA IGG: 278 [AU]/ml

## 2018-04-08 ENCOUNTER — Ambulatory Visit: Payer: BLUE CROSS/BLUE SHIELD | Admitting: Family Medicine

## 2018-04-17 ENCOUNTER — Encounter: Payer: Self-pay | Admitting: Family Medicine

## 2018-04-22 ENCOUNTER — Encounter: Payer: Self-pay | Admitting: Family Medicine

## 2018-06-03 ENCOUNTER — Ambulatory Visit: Payer: BLUE CROSS/BLUE SHIELD

## 2018-06-11 ENCOUNTER — Encounter: Payer: Self-pay | Admitting: Primary Care

## 2018-06-11 ENCOUNTER — Ambulatory Visit: Payer: BLUE CROSS/BLUE SHIELD

## 2018-06-11 ENCOUNTER — Ambulatory Visit: Payer: BLUE CROSS/BLUE SHIELD | Admitting: Primary Care

## 2018-06-11 ENCOUNTER — Ambulatory Visit: Payer: BLUE CROSS/BLUE SHIELD | Admitting: Internal Medicine

## 2018-06-11 VITALS — BP 108/72 | HR 68 | Temp 98.1°F | Ht 63.0 in | Wt 147.5 lb

## 2018-06-11 DIAGNOSIS — Z8619 Personal history of other infectious and parasitic diseases: Secondary | ICD-10-CM | POA: Diagnosis not present

## 2018-06-11 DIAGNOSIS — Z23 Encounter for immunization: Secondary | ICD-10-CM | POA: Diagnosis not present

## 2018-06-11 NOTE — Patient Instructions (Addendum)
You will be contacted regarding your referral to the liver clinic.  Please let us know if you have not been contacted within one week.   Please return in November for your final HPV vaccination.  It was a pleasure to see you today!

## 2018-06-11 NOTE — Addendum Note (Signed)
Addended by: Tawnya CrookSAMBATH, Siah Steely on: 06/11/2018 11:07 AM   Modules accepted: Orders

## 2018-06-11 NOTE — Assessment & Plan Note (Signed)
Diagnosed one year ago, [facility]. Referral placed for second opinion as requested.

## 2018-06-11 NOTE — Assessment & Plan Note (Signed)
Second vaccination provided today, due for last vaccination in November 2019.

## 2018-06-11 NOTE — Progress Notes (Signed)
Subjective:    Patient ID: Stephanie [name], female    DOB: 1991-04-09, 27 y.o.   MRN: [id number]  HPI  Stephanie [name] is a 27 year old female who presents today requesting referral to liver specialist.   She is currently following with a liver specialist for Hepatitis B for which she was diagnosed with one year. She is following with [facility] every 6 months on average, doesn't feel like they are giving her enough information. She has not other complaints today.  She is due for her second HPV vaccination today.   Review of Systems  Gastrointestinal: Negative for abdominal pain, nausea and vomiting.  Skin: Negative for color change.       Past Medical History:  Diagnosis Date  . Hepatitis B    postpartum     Social History   Socioeconomic History  . Marital status: Married    Spouse name: Not on file  . Number of children: Not on file  . Years of education: Not on file  . Highest education level: Not on file  Occupational History  . Not on file  Social Needs  . Financial resource strain: Not on file  . Food insecurity:    Worry: Not on file    Inability: Not on file  . Transportation needs:    Medical: Not on file    Non-medical: Not on file  Tobacco Use  . Smoking status: Never Smoker  . Smokeless tobacco: Never Used  Substance and Sexual Activity  . Alcohol use: Yes    Comment: occasional, before pregnancy  . Drug use: No  . Sexual activity: Yes  Lifestyle  . Physical activity:    Days per week: Not on file    Minutes per session: Not on file  . Stress: Not on file  Relationships  . Social connections:    Talks on phone: Not on file    Gets together: Not on file    Attends religious service: Not on file    Active member of club or organization: Not on file    Attends meetings of clubs or organizations: Not on file    Relationship status: Not on file  . Intimate partner violence:    Fear of current or ex partner: Not on file    Emotionally abused:  Not on file    Physically abused: Not on file    Forced sexual activity: Not on file  Other Topics Concern  . Not on file  Social History Narrative   From [place].   Married.   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   Works as Futures traderhomemaker    Past Surgical History:  Procedure Laterality Date  . NO PAST SURGERIES      No family history on file.  No Known Allergies  No current outpatient medications on file prior to visit.   No current facility-administered medications on file prior to visit.     BP 108/72   Pulse 68   Temp 98.1 F (36.7 C) (Oral)   Ht 5\' 3"  (1.6 m)   Wt 147 lb 8 oz (66.9 kg)   LMP 05/21/2018   SpO2 98%   BMI 26.13 kg/m    Objective:   Physical Exam  Constitutional: She appears well-nourished.  Neck: Neck supple.  Cardiovascular: Normal rate and regular rhythm.  Respiratory: Effort normal and breath sounds normal.  Skin: Skin is warm and dry.           Assessment & Plan:

## 2018-06-17 ENCOUNTER — Telehealth: Payer: Self-pay

## 2018-06-17 NOTE — Telephone Encounter (Signed)
Left message for patient to call Susana Duell back in regards to a referral-Baylei Siebels V Guillermo Nehring, RMA   

## 2018-06-18 ENCOUNTER — Ambulatory Visit: Payer: BLUE CROSS/BLUE SHIELD | Admitting: Primary Care

## 2018-06-29 ENCOUNTER — Encounter (INDEPENDENT_AMBULATORY_CARE_PROVIDER_SITE_OTHER): Payer: Self-pay

## 2018-06-30 ENCOUNTER — Encounter (INDEPENDENT_AMBULATORY_CARE_PROVIDER_SITE_OTHER): Payer: Self-pay

## 2018-09-07 ENCOUNTER — Telehealth: Payer: Self-pay

## 2018-09-07 NOTE — Telephone Encounter (Signed)
Okay to schedule CPE with pap at her convenience.

## 2018-09-07 NOTE — Telephone Encounter (Signed)
PLEASE NOTE: All timestamps contained within this report are represented as [place] Standard Time. CONFIDENTIALTY NOTICE: This fax transmission is intended only for the addressee. It contains information that is legally privileged, confidential or otherwise protected from use or disclosure. If you are not the intended recipient, you are strictly prohibited from reviewing, disclosing, copying using or disseminating any of this information or taking any action in reliance on or regarding this information. If you have received this fax in error, please notify us immediately by telephone so that we can arrange for its return to [place]. Phone: [phone], Toll-Free: [phone], Fax: [phone] Page: 1 of 1 Call Id: [id number] [place] [facility] Night - Client Nonclinical Telephone Record [facility] Client  [facility] Night - Client Client Site  [facility] [place] - Night Physician [facility] - PHYSICIAN, Crissie Figures- MD Contact Type Call Who Is Calling Patient / Member / Family / Caregiver Caller Name Mauriah Mcmillen Caller Phone Number [phone] Patient Name Stephanie [name] Patient DOB [birth year] Call Type Message Only Information Provided Reason for Call Request to Schedule Office Appointment Initial Comment Caller states that she wants to add somethings to her appt. Additional Comment Provided caller with office hours. Caller also stated that her father wants to transfer over to this clinic. Call Closed By: Rockwell [place] Transaction Date/Time: 09/05/2018 1:03:45 PM (ET)

## 2018-09-07 NOTE — Telephone Encounter (Signed)
I spoke to pt; pt has nurse visit on 10/13/18 at 9 AM and wants to change that appt to a CPX with pap before 11 AM on 10/13/18 if possible; if not possible, schedule another morning prior to 11 AM. Pt also wants her father to transfer to [facility] but pt does not know her fathers PCP now. Pt will find out her father's PCP and cb about transfer. Pt request cb about CPX appt.Please advise.

## 2018-09-08 NOTE — Telephone Encounter (Signed)
No answer and no voicemail to leave message

## 2018-10-06 ENCOUNTER — Other Ambulatory Visit: Payer: Self-pay | Admitting: Primary Care

## 2018-10-06 DIAGNOSIS — Z0000 Encounter for general adult medical examination without abnormal findings: Secondary | ICD-10-CM

## 2018-10-09 NOTE — Telephone Encounter (Signed)
CPX scheduled

## 2018-10-13 ENCOUNTER — Ambulatory Visit (INDEPENDENT_AMBULATORY_CARE_PROVIDER_SITE_OTHER): Payer: BLUE CROSS/BLUE SHIELD | Admitting: Emergency Medicine

## 2018-10-13 ENCOUNTER — Other Ambulatory Visit (INDEPENDENT_AMBULATORY_CARE_PROVIDER_SITE_OTHER): Payer: BLUE CROSS/BLUE SHIELD

## 2018-10-13 DIAGNOSIS — Z0000 Encounter for general adult medical examination without abnormal findings: Secondary | ICD-10-CM | POA: Diagnosis not present

## 2018-10-13 DIAGNOSIS — Z23 Encounter for immunization: Secondary | ICD-10-CM

## 2018-10-13 LAB — LIPID PANEL
CHOLESTEROL: 206 mg/dL — AB (ref 0–200)
HDL: 54 mg/dL (ref >39.00)
LDL Cholesterol: 140 mg/dL — ABNORMAL HIGH (ref 0–99)
NonHDL: 152
TRIGLYCERIDES: 61 mg/dL (ref 0.0–149.0)
Total CHOL/HDL Ratio: 4
VLDL: 12.2 mg/dL (ref 0.0–40.0)

## 2018-10-13 LAB — COMPREHENSIVE METABOLIC PANEL
ALT: 18 U/L (ref 0–35)
AST: 14 U/L (ref 0–37)
Albumin: 4.6 g/dL (ref 3.5–5.2)
Alkaline Phosphatase: 42 U/L (ref 39–117)
BUN: 20 mg/dL (ref 6–23)
CHLORIDE: 104 meq/L (ref 96–112)
CO2: 26 meq/L (ref 19–32)
Calcium: 9.9 mg/dL (ref 8.4–10.5)
Creatinine, Ser: 0.71 mg/dL (ref 0.40–1.20)
GFR: 106.33 mL/min (ref >60.00)
GLUCOSE: 101 mg/dL — AB (ref 70–99)
POTASSIUM: 4.4 meq/L (ref 3.5–5.1)
SODIUM: 136 meq/L (ref 135–145)
Total Bilirubin: 0.4 mg/dL (ref 0.2–1.2)
Total Protein: 7.8 g/dL (ref 6.0–8.3)

## 2018-10-13 NOTE — Progress Notes (Signed)
Per orders of Mayra ReelKate Clark, NP, injection of HPV and influenza given by Zollie PeeSierra Sonika Levins. Patient tolerated injection well. Influenza injection administered In right deltoid, HPV in left deltoid.

## 2018-10-23 ENCOUNTER — Encounter: Payer: BLUE CROSS/BLUE SHIELD | Admitting: Primary Care
